# Patient Record
Sex: Female | Born: 1960 | Race: Black or African American | Hispanic: Refuse to answer | Marital: Single | Smoking: Never smoker
Health system: Southern US, Community
[De-identification: ages and names within clinical notes are randomized; demographics above are authoritative.]

## PROBLEM LIST (undated history)

## (undated) DIAGNOSIS — B029 Zoster without complications: Secondary | ICD-10-CM

## (undated) DIAGNOSIS — K8020 Calculus of gallbladder without cholecystitis without obstruction: Secondary | ICD-10-CM

## (undated) DIAGNOSIS — D219 Benign neoplasm of connective and other soft tissue, unspecified: Secondary | ICD-10-CM

## (undated) DIAGNOSIS — R7989 Other specified abnormal findings of blood chemistry: Principal | ICD-10-CM

## (undated) DIAGNOSIS — K219 Gastro-esophageal reflux disease without esophagitis: Secondary | ICD-10-CM

## (undated) DIAGNOSIS — J309 Allergic rhinitis, unspecified: Secondary | ICD-10-CM

## (undated) HISTORY — DX: Other specified abnormal findings of blood chemistry: R79.89

## (undated) HISTORY — DX: Gastro-esophageal reflux disease without esophagitis: K21.9

## (undated) HISTORY — DX: Allergic rhinitis, unspecified: J30.9

## (undated) HISTORY — DX: Calculus of gallbladder without cholecystitis without obstruction: K80.20

## (undated) HISTORY — DX: Zoster without complications: B02.9

## (undated) HISTORY — DX: Morbid (severe) obesity due to excess calories: E66.01

## (undated) HISTORY — DX: Benign neoplasm of connective and other soft tissue, unspecified: D21.9

---

## 2000-01-20 ENCOUNTER — Other Ambulatory Visit: Admission: RE | Admit: 2000-01-20 | Discharge: 2000-01-20 | Payer: Self-pay | Admitting: Obstetrics and Gynecology

## 2001-02-22 ENCOUNTER — Other Ambulatory Visit: Admission: RE | Admit: 2001-02-22 | Discharge: 2001-02-22 | Payer: Self-pay | Admitting: Obstetrics and Gynecology

## 2001-03-16 ENCOUNTER — Encounter: Payer: Self-pay | Admitting: Obstetrics and Gynecology

## 2001-03-16 ENCOUNTER — Ambulatory Visit (HOSPITAL_COMMUNITY): Admission: RE | Admit: 2001-03-16 | Discharge: 2001-03-16 | Payer: Self-pay | Admitting: Obstetrics and Gynecology

## 2002-02-26 ENCOUNTER — Other Ambulatory Visit: Admission: RE | Admit: 2002-02-26 | Discharge: 2002-02-26 | Payer: Self-pay | Admitting: Obstetrics and Gynecology

## 2003-02-28 ENCOUNTER — Other Ambulatory Visit: Admission: RE | Admit: 2003-02-28 | Discharge: 2003-02-28 | Payer: Self-pay | Admitting: Obstetrics and Gynecology

## 2004-03-04 ENCOUNTER — Other Ambulatory Visit: Admission: RE | Admit: 2004-03-04 | Discharge: 2004-03-04 | Payer: Self-pay | Admitting: Obstetrics and Gynecology

## 2004-04-12 HISTORY — PX: LAPAROSCOPIC GASTRIC BANDING: SHX1100

## 2004-05-21 ENCOUNTER — Ambulatory Visit (HOSPITAL_COMMUNITY): Admission: RE | Admit: 2004-05-21 | Discharge: 2004-05-21 | Payer: Self-pay | Admitting: General Surgery

## 2004-05-21 ENCOUNTER — Encounter: Admission: RE | Admit: 2004-05-21 | Discharge: 2004-08-19 | Payer: Self-pay | Admitting: General Surgery

## 2004-09-25 ENCOUNTER — Encounter: Admission: RE | Admit: 2004-09-25 | Discharge: 2004-12-24 | Payer: Self-pay | Admitting: General Surgery

## 2004-09-28 ENCOUNTER — Observation Stay (HOSPITAL_COMMUNITY): Admission: RE | Admit: 2004-09-28 | Discharge: 2004-09-29 | Payer: Self-pay | Admitting: General Surgery

## 2005-04-02 ENCOUNTER — Other Ambulatory Visit: Admission: RE | Admit: 2005-04-02 | Discharge: 2005-04-02 | Payer: Self-pay | Admitting: Obstetrics and Gynecology

## 2005-05-07 ENCOUNTER — Ambulatory Visit: Payer: Self-pay | Admitting: Internal Medicine

## 2005-05-11 ENCOUNTER — Ambulatory Visit: Payer: Self-pay | Admitting: Internal Medicine

## 2005-12-09 ENCOUNTER — Encounter: Admission: RE | Admit: 2005-12-09 | Discharge: 2005-12-09 | Payer: Self-pay | Admitting: General Surgery

## 2006-03-16 ENCOUNTER — Ambulatory Visit: Payer: Self-pay | Admitting: Internal Medicine

## 2006-04-13 ENCOUNTER — Ambulatory Visit: Payer: Self-pay | Admitting: Internal Medicine

## 2007-03-14 ENCOUNTER — Ambulatory Visit (HOSPITAL_COMMUNITY): Admission: RE | Admit: 2007-03-14 | Discharge: 2007-03-14 | Payer: Self-pay | Admitting: General Surgery

## 2007-04-25 ENCOUNTER — Telehealth: Payer: Self-pay | Admitting: Internal Medicine

## 2007-10-02 ENCOUNTER — Telehealth: Payer: Self-pay | Admitting: Internal Medicine

## 2008-02-15 ENCOUNTER — Ambulatory Visit: Payer: Self-pay | Admitting: Internal Medicine

## 2008-02-15 DIAGNOSIS — J309 Allergic rhinitis, unspecified: Secondary | ICD-10-CM | POA: Insufficient documentation

## 2008-02-15 DIAGNOSIS — K219 Gastro-esophageal reflux disease without esophagitis: Secondary | ICD-10-CM

## 2008-02-15 HISTORY — DX: Morbid (severe) obesity due to excess calories: E66.01

## 2008-02-15 HISTORY — DX: Gastro-esophageal reflux disease without esophagitis: K21.9

## 2008-02-15 HISTORY — DX: Allergic rhinitis, unspecified: J30.9

## 2008-07-26 ENCOUNTER — Ambulatory Visit: Payer: Self-pay | Admitting: Internal Medicine

## 2008-07-26 DIAGNOSIS — R079 Chest pain, unspecified: Secondary | ICD-10-CM

## 2008-07-26 DIAGNOSIS — B029 Zoster without complications: Secondary | ICD-10-CM

## 2008-07-26 HISTORY — DX: Zoster without complications: B02.9

## 2009-08-25 ENCOUNTER — Telehealth: Payer: Self-pay | Admitting: Internal Medicine

## 2009-11-13 ENCOUNTER — Ambulatory Visit: Payer: Self-pay | Admitting: Internal Medicine

## 2009-11-14 ENCOUNTER — Ambulatory Visit: Payer: Self-pay | Admitting: Internal Medicine

## 2010-01-10 LAB — HM PAP SMEAR: HM Pap smear: NORMAL

## 2010-05-12 NOTE — Assessment & Plan Note (Signed)
Summary: follow up-lb   Vital Signs:  Patient profile:   50 year old female Height:      66 inches Weight:      277 pounds BMI:     44.87 O2 Sat:      94 % on Room air Temp:     98.4 degrees F oral Pulse rate:   71 / minute Pulse rhythm:   regular Resp:     16 per minute BP sitting:   100 / 60  (right arm) Cuff size:   large  Vitals Entered By: Lanier Prude, CMA(AAMA) (November 14, 2009 4:27 PM)  O2 Flow:  Room air CC: f/u Is Patient Diabetic? No Comments pt states she is not taking Protonix, Vit D or Loratadine.  please remove from list   Primary Care Provider:  Tresa Garter MD  CC:  f/u.  History of Present Illness: F/u GERD  Current Medications (verified): 1)  Vitamin D3 1000 Unit  Tabs (Cholecalciferol) .Marland Kitchen.. 1 By Mouth Daily 2)  Protonix 40 Mg  Tbec (Pantoprazole Sodium) .... Once Daily 3)  Loratadine 10 Mg  Tabs (Loratadine) .... Once Daily As Needed Allergies 4)  Nexium 40 Mg Cpdr (Esomeprazole Magnesium) .Marland Kitchen.. 1 As Needed 5)  Multivitamins  Tabs (Multiple Vitamin) .Marland Kitchen.. 1 Once Daily  Allergies (verified): No Known Drug Allergies  Past History:  Social History: Last updated: 07/26/2008 Single Never Smoked  Past Medical History: GERD Obesity Allergic rhinitis GYN B Silva  Review of Systems  The patient denies chest pain.    Physical Exam  General:  overweight-appearing.   Nose:  External nasal examination shows no deformity or inflammation. Nasal mucosa are pink and moist without lesions or exudates. Mouth:  Oral mucosa and oropharynx without lesions or exudates.  Teeth in good repair. Neck:  No deformities, masses, or tenderness noted. Lungs:  Normal respiratory effort, chest expands symmetrically. Lungs are clear to auscultation, no crackles or wheezes. Heart:  Normal rate and regular rhythm. S1 and S2 normal without gallop, murmur, click, rub or other extra sounds. Abdomen:  Bowel sounds positive,abdomen soft and non-tender without  masses, organomegaly or hernias noted.   Impression & Recommendations:  Problem # 1:  GERD (ICD-530.81) Assessment Unchanged     Nexium 40 Mg Cpdr (Esomeprazole magnesium) .Marland Kitchen... 1 as needed  Problem # 2:  OBESITY, MORBID (ICD-278.01) Assessment: Unchanged  Problem # 3:  Preventive Health Care (ICD-V70.0) Assessment: Comment Only Yearly w/labs w/Dr Josefine Class  Complete Medication List: 1)  Vitamin D3 1000 Unit Tabs (Cholecalciferol) .Marland Kitchen.. 1 by mouth daily 2)  Protonix 40 Mg Tbec (Pantoprazole sodium) .... Once daily 3)  Loratadine 10 Mg Tabs (Loratadine) .... Once daily as needed allergies 4)  Nexium 40 Mg Cpdr (Esomeprazole magnesium) .Marland Kitchen.. 1 as needed 5)  Multivitamins Tabs (Multiple vitamin) .Marland Kitchen.. 1 once daily  Patient Instructions: 1)  Please schedule a follow-up appointment in 1 year. Prescriptions: NEXIUM 40 MG CPDR (ESOMEPRAZOLE MAGNESIUM) 1 as needed  #30 x 12   Entered and Authorized by:   Tresa Garter MD   Signed by:   Tresa Garter MD on 11/14/2009   Method used:   Print then Give to Patient   RxID:   [id number]

## 2010-05-12 NOTE — Progress Notes (Signed)
Summary: SAMPLES  Phone Note Call from Patient Call back at [phone]   Summary of Call: Patient is requesting samples of Nexium. This is not on med list. Last office visit > 1 year ago.  Initial call taken by: Lamar Sprinkles, CMA,  Aug 25, 2009 11:12 AM  Follow-up for Phone Call        OK if we have Sch ov Follow-up by: Tresa Garter MD,  Aug 25, 2009 5:50 PM  Additional Follow-up for Phone Call Additional follow up Details #1::        pt informed to set up office visit and was given dexilant samples instead of nexium. that was all that was available Additional Follow-up by: Ami Bullins CMA,  Aug 26, 2009 8:39 AM

## 2010-08-25 NOTE — Op Note (Signed)
NAMEMAPLE, ODANIEL               ACCOUNT NO.:  [id number]   MEDICAL RECORD NO.:  [id number]          PATIENT TYPE:  AMB   LOCATION:  DAY                          FACILITY:  [facility]   PHYSICIAN:  Sharlet Salina T. Hoxworth, M.D.DATE OF BIRTH:  1961-01-26   DATE OF PROCEDURE:  03/14/2007  DATE OF DISCHARGE:                               OPERATIVE REPORT   PREOPERATIVE DIAGNOSES:  Leaking lap band system.   POSTOPERATIVE DIAGNOSIS:  Leaking lap band system.   SURGICAL PROCEDURES:  Replacement of lap band port.   SURGEON:  Dr. Johna Sheriff.   ANESTHESIA:  General.   BRIEF HISTORY:  Brianna Bean is a 50 year old female status post lap  band placement over one year ago.  She had initially very good weight  loss but more recently experienced loss of restriction and some weight  regain.  On accessing her band on the last three visits there has been  much less fluid found in the band than expected consistent with a slow  leak.  I have recommended replacement of the port.  The nature of  procedure, indications, risks of bleeding, infection were discussed and  understood.  She is now brought to the operating room for the procedure.   DESCRIPTION OF OPERATION:  The patient brought to the operating room,  placed in the supine position on the operating table and general  orotracheal anesthesia was induced.  She received preoperative IV  antibiotics.  The abdomen was widely sterilely prepped and draped.  Correct patient procedure were verified.  The old scar at the port site  was excised and dissection carried down through subcutaneous tissue with  cautery.  Dissection was carried down onto the port.  The scar tissue  around the port was excised and the sutures exposed and removed and the  port mobilized off the anterior abdominal wall.  The port and tubing  were brought out onto the abdominal wall.  I did not see any obvious  defects in the port or tubing.  There was some thinning of the tubing at  the junction of the port and the lap band tubing but no obvious leak  here.  I divided the tubing just on the band side at the connection.  With the tubing of the port clamped, I did inject fluid under pressure  and I could not identify any definite leak.  Following this a new  flushed port was attached to the tubing after trimming the tubing back a  little bit further.  It was then sutured to the anterior abdominal wall  with four interrupted Prolene sutures.  The tubing was seen to curve  nicely into the abdomen.  The port was then filled with 2.7 mL of  sterile saline.  The wound was irrigated and injected with Marcaine.  Hemostasis assured.  The subcutaneous was closed with running 3-0 Vicryl  and the skin closed with running subcuticular 4-0 Monocryl and  Dermabond.  Sponge, needle and instrument counts correct.  The patient  was taken to recovery in good condition.      Lorne Skeens. Hoxworth, M.D.  Electronically Signed  BTH/MEDQ  D:  03/14/2007  T:  03/14/2007  Job:  [id number]

## 2010-08-28 NOTE — Op Note (Signed)
**Note Brianna Bean** NAMEJEROLENE, KUPFER               ACCOUNT NO.:  [id number]   MEDICAL RECORD NO.:  [id number]          PATIENT TYPE:  OBV   LOCATION:  1511                         FAC[facility]   PHYSICIAN:  Sharlet Salina T. Hoxworth, M.D.DATE OF BIRTH:  11/07/1960   DATE OF PROCEDURE:  09/28/2004  DATE OF DISCHARGE:                                 OPERATIVE REPORT   PREOPERATIVE DIAGNOSES:  Morbid obesity surgery.   POSTOPERATIVE DIAGNOSES:  Morbid obesity surgery.   PROCEDURE:  Laparoscopic adjustable gastric banding.   SURGEON:  Lorne Skeens. Hoxworth, M.D.   ASSISTANT:  Luretha Murphy, M.D.   ANESTHESIA:  General.   BRIEF HISTORY:  Ms. Brianna Bean is a 50 year old female with a lifelong history of  progressive obesity unresponsive to medical management. Her weight is  approximately 360 pounds with a BMI of 58. After extensive preoperative  discussion and education detailed elsewhere, she was brought to the  operating room for a laparoscopic adjustable gastric band placement.   DESCRIPTION OF PROCEDURE:  The patient was brought to the operating room,  placed in supine position on the operating table and general endotracheal  anesthesia was induced. She had been given preoperative antibiotics. Heparin  5000 units had been given subcutaneously. PAS were in place. The abdomen was  widely sterilely prepped and draped. A 1 cm incision was made in the left  subcostal area and access obtained with an OptiView trocar without  difficulty and pneumoperitoneum established. Under direct vision, an 11 mm  trocar was placed through the falciform ligament in the right paraxiphoid  area, a 12 mm trocar in the right upper mid abdomen, a 11 mL trocar in the  left periumbilical space for a camera port and a 5 mm trocar in the left  flank. Through a 5 mm port site in the epigastrium, the Nathanson retractor  was placed and the left lobe of the liver elevated with excellent exposure  of the upper stomach and hiatus.  The area was examined and I did not see any  evidence of any significant hiatal hernia. The left crus was identified and  the peritoneum over the left crus was incised with the hook cautery leaving  more lateral attachments to the fundus intact. Using the finger dissector,  blunt dissection was carried down along the left crus toward the  retrogastric area. Following this, the  avascular portion of the pars  flaccida was opened with the finger retractor. The vena cava and right crus  were identified. At the base of the right crus at the area of the crossing  band of fat, the retroperitoneum was carefully opened with blunt dissection  down just medial to the right crus. The finger dissector was then introduced  into the space from the paraxiphoid port and was passed in the retrogastric  space with no force and then the finger flexed and the tip came out nicely  up along the left crus were a few filmy attachments were taken down for it  to come through freely. Following this, the 12 mm port was removed, the site  dilated with the finger and  the flushed band introduced into the abdominal  cavity and the trocar replaced. The tubing was inserted into the finger  retractor which was withdrawn back beneath the stomach and the tubing and  band brought through the retrogastric area without difficulty. The tubing  was then passed through the buckle and with the calibration tube passed  orally by anesthesia to the stomach, the band was snapped into place without  any tension and was seen to still swivel freely. The calibration tube was  removed. The tubing was retracted toward the right lower quadrant exposing  the small gastric pouch. The fundus was then imbricated up over the band to  the gastric pouch with  three interrupted 2-0 Vicryl sutures. The buckle was  seen not to be pressing against this area and the band still rotated freely.  Following this the tubing was brought out through the 12 mm  site, the  [facility] retractor was removed and all CO2 evacuated and trocars removed.  This incision was elongated slightly laterally and dissection carried down  to the rectus sheath with the cautery and this was cleared a couple of  centimeters in all directions. Four sutures of 2-0 Prolene were placed in  four quadrants and passed through the port which had been attached to the  tubing and then the port was securely sutured flat down to the anterior  rectus sheath with these sutures. The tubing was fully introduced into the  abdomen and was seen to curve in gently with no kinks. Following this, the  subcutaneous tissue at this site was closed with running 3-0 Vicryl and skin  incisions were closed with staples. Sponge and needle counts correct. Dry  sterile dressings were applied and the patient taken to recovery in good  condition.       BTH/MEDQ  D:  09/28/2004  T:  09/29/2004  Job:  [id number]

## 2010-11-16 ENCOUNTER — Other Ambulatory Visit: Payer: Self-pay | Admitting: Internal Medicine

## 2010-12-03 ENCOUNTER — Encounter: Payer: Self-pay | Admitting: Internal Medicine

## 2010-12-03 DIAGNOSIS — Z0000 Encounter for general adult medical examination without abnormal findings: Secondary | ICD-10-CM

## 2010-12-04 ENCOUNTER — Other Ambulatory Visit (INDEPENDENT_AMBULATORY_CARE_PROVIDER_SITE_OTHER): Payer: Self-pay

## 2010-12-04 ENCOUNTER — Other Ambulatory Visit: Payer: Self-pay

## 2010-12-04 DIAGNOSIS — Z0000 Encounter for general adult medical examination without abnormal findings: Secondary | ICD-10-CM

## 2010-12-04 LAB — COMPREHENSIVE METABOLIC PANEL
ALT: 16 U/L (ref 0–35)
Albumin: 3.4 g/dL — ABNORMAL LOW (ref 3.5–5.2)
Alkaline Phosphatase: 72 U/L (ref 39–117)
Glucose, Bld: 88 mg/dL (ref 70–99)
Potassium: 4.2 mEq/L (ref 3.5–5.1)
Sodium: 140 mEq/L (ref 135–145)
Total Protein: 7 g/dL (ref 6.0–8.3)

## 2010-12-04 LAB — CBC WITH DIFFERENTIAL/PLATELET
Basophils Absolute: 0 10*3/uL (ref 0.0–0.1)
Eosinophils Relative: 0.9 % (ref 0.0–5.0)
MCV: 73 fl — ABNORMAL LOW (ref 78.0–100.0)
Monocytes Absolute: 0.5 10*3/uL (ref 0.1–1.0)
Monocytes Relative: 10.3 % (ref 3.0–12.0)
Neutrophils Relative %: 49.1 % (ref 43.0–77.0)
Platelets: 298 10*3/uL (ref 150.0–400.0)
RDW: 15.5 % — ABNORMAL HIGH (ref 11.5–14.6)
WBC: 4.5 10*3/uL (ref 4.5–10.5)

## 2010-12-04 LAB — URINALYSIS, ROUTINE W REFLEX MICROSCOPIC
Leukocytes, UA: NEGATIVE
Nitrite: NEGATIVE
Total Protein, Urine: NEGATIVE
Urobilinogen, UA: 4 (ref 0.0–1.0)

## 2010-12-04 LAB — TSH: TSH: 1.92 u[IU]/mL (ref 0.35–5.50)

## 2010-12-04 LAB — LIPID PANEL
Cholesterol: 154 mg/dL (ref 0–200)
LDL Cholesterol: 93 mg/dL (ref 0–99)
Total CHOL/HDL Ratio: 3
VLDL: 5.4 mg/dL (ref 0.0–40.0)

## 2010-12-15 ENCOUNTER — Encounter: Payer: Self-pay | Admitting: Internal Medicine

## 2011-01-13 ENCOUNTER — Encounter: Payer: Self-pay | Admitting: Internal Medicine

## 2011-01-14 ENCOUNTER — Ambulatory Visit (INDEPENDENT_AMBULATORY_CARE_PROVIDER_SITE_OTHER): Payer: 59 | Admitting: Internal Medicine

## 2011-01-14 ENCOUNTER — Encounter: Payer: Self-pay | Admitting: Internal Medicine

## 2011-01-14 VITALS — BP 120/68 | HR 50 | Temp 98.7°F | Wt 272.0 lb

## 2011-01-14 DIAGNOSIS — Z0000 Encounter for general adult medical examination without abnormal findings: Secondary | ICD-10-CM

## 2011-01-14 DIAGNOSIS — M79605 Pain in left leg: Secondary | ICD-10-CM | POA: Insufficient documentation

## 2011-01-14 DIAGNOSIS — M79609 Pain in unspecified limb: Secondary | ICD-10-CM

## 2011-01-14 DIAGNOSIS — K219 Gastro-esophageal reflux disease without esophagitis: Secondary | ICD-10-CM

## 2011-01-14 DIAGNOSIS — D649 Anemia, unspecified: Secondary | ICD-10-CM

## 2011-01-14 MED ORDER — ESOMEPRAZOLE MAGNESIUM 40 MG PO CPDR: 40.0000 mg | DELAYED_RELEASE_CAPSULE | Freq: Every day | ORAL | Status: DC

## 2011-01-14 NOTE — Assessment & Plan Note (Signed)
Apples, OTC iron Labs in 3 mo

## 2011-01-14 NOTE — Assessment & Plan Note (Addendum)
We discussed age appropriate health related issues, including available/recomended screening tests and vaccinations. We discussed a need for adhering to healthy diet and exercise. Labs/EKG were reviewed/ordered. All questions were answered. Cont w/wt loss

## 2011-01-14 NOTE — Progress Notes (Signed)
  Subjective:    Patient ID: Brianna Bean, female    DOB: [birth year], 50 y.o.   MRN: [id number]  HPI  The patient is here for a wellness exam. The patient has been doing well overall without major physical or psychological issues going on lately. C/o shins burning at times w/running  Review of Systems  Constitutional: Negative for fever, chills, diaphoresis, activity change, appetite change, fatigue and unexpected weight change.  HENT: Negative for hearing loss, ear Bean, congestion, sore throat, sneezing, mouth sores, neck Bean, dental problem, voice change, postnasal drip and sinus pressure.   Eyes: Negative for Bean and visual disturbance.  Respiratory: Negative for cough, chest tightness, wheezing and stridor.   Cardiovascular: Negative for chest Bean, palpitations and leg swelling.  Gastrointestinal: Negative for nausea, vomiting, abdominal Bean, blood in stool, abdominal distention and rectal Bean.  Genitourinary: Negative for dysuria, hematuria, decreased urine volume, vaginal bleeding, vaginal discharge, difficulty urinating, vaginal Bean and menstrual problem.  Musculoskeletal: Negative for back Bean, joint swelling, arthralgias and gait problem.  Skin: Negative for color change, rash and wound.  Neurological: Negative for dizziness, tremors, syncope, speech difficulty and light-headedness.  Hematological: Negative for adenopathy.  Psychiatric/Behavioral: Negative for suicidal ideas, hallucinations, behavioral problems, confusion, sleep disturbance, dysphoric mood and decreased concentration. The patient is not hyperactive.    Wt Readings from Last 3 Encounters:  01/14/11 272 lb (123.378 kg)  11/14/09 277 lb (125.646 kg)  07/26/08 267 lb (121.11 kg)       Objective:   Physical Exam  Constitutional: She appears well-developed. No distress.       Obese  HENT:  Head: Normocephalic.  Right Ear: External ear normal.  Left Ear: External ear normal.  Nose: Nose normal.    Mouth/Throat: Oropharynx is clear and moist.  Eyes: Conjunctivae are normal. Pupils are equal, round, and reactive to light. Right eye exhibits no discharge. Left eye exhibits no discharge.  Neck: Normal range of motion. Neck supple. No JVD present. No tracheal deviation present. No thyromegaly present.  Cardiovascular: Normal rate, regular rhythm and normal heart sounds.   Pulmonary/Chest: No stridor. No respiratory distress. She has no wheezes.  Abdominal: Soft. Bowel sounds are normal. She exhibits no distension and no mass. There is no tenderness. There is no rebound and no guarding.  Musculoskeletal: She exhibits tenderness (B inner shins). She exhibits no edema.  Lymphadenopathy:    She has no cervical adenopathy.  Neurological: She displays normal reflexes. No cranial nerve deficit. She exhibits normal muscle tone. Coordination normal.  Skin: No rash noted. No erythema.  Psychiatric: She has a normal mood and affect. Her behavior is normal. Judgment and thought content normal.    Lab Results  Component Value Date   WBC 4.5 12/04/2010   HGB 11.4* 12/04/2010   HCT 35.3* 12/04/2010   PLT 298.0 12/04/2010   GLUCOSE 88 12/04/2010   CHOL 154 12/04/2010   TRIG 27.0 12/04/2010   HDL 55.30 12/04/2010   LDLCALC 93 12/04/2010   ALT 16 12/04/2010   AST 16 12/04/2010   NA 140 12/04/2010   K 4.2 12/04/2010   CL 108 12/04/2010   CREATININE 0.8 12/04/2010   BUN 9 12/04/2010   CO2 27 12/04/2010   TSH 1.92 12/04/2010         Assessment & Plan:

## 2011-01-14 NOTE — Assessment & Plan Note (Signed)
Cont w/diet 

## 2011-01-14 NOTE — Assessment & Plan Note (Signed)
B medial shin pain, [facility] ref was offered Ibuprofen prn

## 2011-01-14 NOTE — Assessment & Plan Note (Signed)
Nexium Rx 

## 2011-01-18 LAB — BASIC METABOLIC PANEL
BUN: 8
Chloride: 110
GFR calc Af Amer: >60
Potassium: 4.5
Sodium: 143

## 2011-01-18 LAB — CBC
HCT: 34.5 — ABNORMAL LOW
Hemoglobin: 11.4 — ABNORMAL LOW
MCV: 70.6 — ABNORMAL LOW
RBC: 4.88
WBC: 5.8

## 2011-01-18 LAB — DIFFERENTIAL
Eosinophils Absolute: 0.1 — ABNORMAL LOW
Eosinophils Relative: 1
Lymphocytes Relative: 24
Lymphs Abs: 1.4
Monocytes Relative: 8

## 2011-01-18 LAB — PREGNANCY, URINE: Preg Test, Ur: NEGATIVE

## 2011-04-23 ENCOUNTER — Ambulatory Visit: Payer: 59 | Admitting: Internal Medicine

## 2011-08-25 ENCOUNTER — Encounter (INDEPENDENT_AMBULATORY_CARE_PROVIDER_SITE_OTHER): Payer: Self-pay | Admitting: General Surgery

## 2011-08-25 ENCOUNTER — Ambulatory Visit (INDEPENDENT_AMBULATORY_CARE_PROVIDER_SITE_OTHER): Payer: 59 | Admitting: General Surgery

## 2011-08-25 DIAGNOSIS — Z4651 Encounter for fitting and adjustment of gastric lap band: Secondary | ICD-10-CM

## 2011-08-25 NOTE — Progress Notes (Signed)
Chief complaint: Followup lap band  History:Patient returns for followup of lap band placed June 2006. She has a 10 cm band. She had her port replaced due to leak in 2008. I last saw her one year ago. She states that she lost some weight early in the past year but has regained this in recent months. She is able to eat a little more and is feeling more hungry. She continues to exercise regularly with a personal trainer and running. We reviewed her diet which is excellent she is not resorting to sweets or soft foods. She feels like she needs a fill.  Past medical history: She did not have significant comorbidities despite her preop super morbid obesity  Exam: BP 126/74  Pulse 60  Temp(Src) 96.6 F (35.9 C) (Temporal)  Resp 16  Ht 5\' 6"  (1.676 m)  Wt 282 lb (127.914 kg)  BMI 45.52 kg/m2 Total weight loss 80 pounds Gen.: Appears well in no distress Abdomen: Soft and nontender. Wounds well healed and port site looks fine  With this information we went ahead with a fill today. I added 0.2 cc without difficulty to bring her up to 3.7 cc. She tolerated water well. We have a default return appointment in one year. She will call me if she does not have the desired effect from this fill.

## 2011-09-03 ENCOUNTER — Encounter (INDEPENDENT_AMBULATORY_CARE_PROVIDER_SITE_OTHER): Payer: 59 | Admitting: General Surgery

## 2012-07-05 ENCOUNTER — Encounter (INDEPENDENT_AMBULATORY_CARE_PROVIDER_SITE_OTHER): Payer: Self-pay | Admitting: General Surgery

## 2012-08-18 ENCOUNTER — Telehealth: Payer: Self-pay | Admitting: *Deleted

## 2012-08-18 DIAGNOSIS — Z0000 Encounter for general adult medical examination without abnormal findings: Secondary | ICD-10-CM

## 2012-08-18 NOTE — Telephone Encounter (Signed)
Labs entered into Epic for upcoming CPX appointment. 

## 2012-08-18 NOTE — Telephone Encounter (Signed)
Message copied by Carin Primrose on Fri Aug 18, 2012 10:48 AM ------      Message from: Etheleen Sia      Created: Fri Aug 18, 2012 10:40 AM      Regarding: LAB       PHYSICAL LABS IN [place] ------

## 2012-09-01 ENCOUNTER — Other Ambulatory Visit (INDEPENDENT_AMBULATORY_CARE_PROVIDER_SITE_OTHER): Payer: 59

## 2012-09-01 DIAGNOSIS — Z0000 Encounter for general adult medical examination without abnormal findings: Secondary | ICD-10-CM

## 2012-09-01 LAB — CBC WITH DIFFERENTIAL/PLATELET
Basophils Relative: 0.6 % (ref 0.0–3.0)
Eosinophils Relative: 0.3 % (ref 0.0–5.0)
HCT: 38 % (ref 36.0–46.0)
Lymphs Abs: 2 10*3/uL (ref 0.7–4.0)
MCV: 71.5 fl — ABNORMAL LOW (ref 78.0–100.0)
Monocytes Absolute: 0.5 10*3/uL (ref 0.1–1.0)
Monocytes Relative: 8.3 % (ref 3.0–12.0)
Neutrophils Relative %: 53.5 % (ref 43.0–77.0)
RBC: 5.31 Mil/uL — ABNORMAL HIGH (ref 3.87–5.11)
WBC: 5.4 10*3/uL (ref 4.5–10.5)

## 2012-09-01 LAB — URINALYSIS
Bilirubin Urine: NEGATIVE
Hgb urine dipstick: NEGATIVE
Total Protein, Urine: NEGATIVE
Urine Glucose: NEGATIVE
Urobilinogen, UA: 2 (ref 0.0–1.0)

## 2012-09-01 LAB — HEPATIC FUNCTION PANEL
ALT: 11 U/L (ref 0–35)
AST: 16 U/L (ref 0–37)
Bilirubin, Direct: 0.1 mg/dL (ref 0.0–0.3)
Total Protein: 7.9 g/dL (ref 6.0–8.3)

## 2012-09-01 LAB — LIPID PANEL
Cholesterol: 173 mg/dL (ref 0–200)
LDL Cholesterol: 107 mg/dL — ABNORMAL HIGH (ref 0–99)
Total CHOL/HDL Ratio: 3
VLDL: 6.2 mg/dL (ref 0.0–40.0)

## 2012-09-01 LAB — BASIC METABOLIC PANEL
Chloride: 105 mEq/L (ref 96–112)
GFR: 106.75 mL/min (ref >60.00)
Potassium: 4.6 mEq/L (ref 3.5–5.1)

## 2012-09-01 LAB — TSH: TSH: 1.66 u[IU]/mL (ref 0.35–5.50)

## 2012-09-13 ENCOUNTER — Ambulatory Visit (INDEPENDENT_AMBULATORY_CARE_PROVIDER_SITE_OTHER): Payer: BC Managed Care – PPO | Admitting: Internal Medicine

## 2012-09-13 ENCOUNTER — Other Ambulatory Visit: Payer: BC Managed Care – PPO

## 2012-09-13 ENCOUNTER — Encounter: Payer: Self-pay | Admitting: Internal Medicine

## 2012-09-13 VITALS — BP 112/82 | HR 76 | Temp 98.1°F | Resp 16 | Ht 65.0 in | Wt 266.0 lb

## 2012-09-13 DIAGNOSIS — K219 Gastro-esophageal reflux disease without esophagitis: Secondary | ICD-10-CM

## 2012-09-13 DIAGNOSIS — L500 Allergic urticaria: Secondary | ICD-10-CM

## 2012-09-13 DIAGNOSIS — Z0000 Encounter for general adult medical examination without abnormal findings: Secondary | ICD-10-CM

## 2012-09-13 MED ORDER — TRIAMCINOLONE ACETONIDE 0.5 % EX CREA: TOPICAL_CREAM | Freq: Three times a day (TID) | CUTANEOUS | Status: DC

## 2012-09-13 NOTE — Progress Notes (Signed)
   Subjective:    HPI  The patient is here for a wellness exam. The patient has been doing well overall without major physical or psychological issues going on lately, except for hives - ?food triggered and GERD sx's at times  Wt Readings from Last 3 Encounters:  09/13/12 266 lb (120.657 kg)  08/25/11 282 lb (127.914 kg)  01/14/11 272 lb (123.378 kg)   BP Readings from Last 3 Encounters:  09/13/12 112/82  08/25/11 126/74  01/14/11 120/68      Review of Systems  Constitutional: Negative for fever, chills, diaphoresis, activity change, appetite change, fatigue and unexpected weight change.  HENT: Negative for hearing loss, ear pain, congestion, sore throat, sneezing, mouth sores, neck pain, dental problem, voice change, postnasal drip and sinus pressure.   Eyes: Negative for pain and visual disturbance.  Respiratory: Negative for cough, chest tightness, wheezing and stridor.   Cardiovascular: Negative for chest pain, palpitations and leg swelling.  Gastrointestinal: Negative for nausea, vomiting, abdominal pain, blood in stool, abdominal distention and rectal pain.  Genitourinary: Negative for dysuria, hematuria, decreased urine volume, vaginal bleeding, vaginal discharge, difficulty urinating, vaginal pain and menstrual problem.  Musculoskeletal: Negative for back pain, joint swelling, arthralgias and gait problem.  Skin: Negative for color change, rash and wound.  Neurological: Negative for dizziness, tremors, syncope, speech difficulty and light-headedness.  Hematological: Negative for adenopathy.  Psychiatric/Behavioral: Negative for suicidal ideas, hallucinations, behavioral problems, confusion, sleep disturbance, dysphoric mood and decreased concentration. The patient is not hyperactive.         Objective:   Physical Exam  Constitutional: She appears well-developed. No distress.  Obese  HENT:  Head: Normocephalic.  Right Ear: External ear normal.  Left Ear: External  ear normal.  Nose: Nose normal.  Mouth/Throat: Oropharynx is clear and moist.  Eyes: Conjunctivae are normal. Pupils are equal, round, and reactive to light. Right eye exhibits no discharge. Left eye exhibits no discharge.  Neck: Normal range of motion. Neck supple. No JVD present. No tracheal deviation present. No thyromegaly present.  Cardiovascular: Normal rate, regular rhythm and normal heart sounds.   Pulmonary/Chest: No stridor. No respiratory distress. She has no wheezes.  Abdominal: Soft. Bowel sounds are normal. She exhibits no distension and no mass. There is no tenderness. There is no rebound and no guarding.  Musculoskeletal: She exhibits no edema and no tenderness.  Lymphadenopathy:    She has no cervical adenopathy.  Neurological: She displays normal reflexes. No cranial nerve deficit. She exhibits normal muscle tone. Coordination normal.  Skin: Rash noted. No erythema.  4 mm hive R cheek  Psychiatric: She has a normal mood and affect. Her behavior is normal. Judgment and thought content normal.    Lab Results  Component Value Date   WBC 5.4 09/01/2012   HGB 12.4 09/01/2012   HCT 38.0 09/01/2012   PLT 362.0 09/01/2012   GLUCOSE 86 09/01/2012   CHOL 173 09/01/2012   TRIG 31.0 09/01/2012   HDL 60.10 09/01/2012   LDLCALC 107* 09/01/2012   ALT 11 09/01/2012   AST 16 09/01/2012   NA 137 09/01/2012   K 4.6 09/01/2012   CL 105 09/01/2012   CREATININE 0.7 09/01/2012   BUN 12 09/01/2012   CO2 27 09/01/2012   TSH 1.66 09/01/2012         Assessment & Plan:

## 2012-09-13 NOTE — Assessment & Plan Note (Signed)
Nexium 

## 2012-09-13 NOTE — Assessment & Plan Note (Signed)
?  food allergy Food IgE

## 2012-09-13 NOTE — Assessment & Plan Note (Signed)
Wt Readings from Last 3 Encounters:  09/13/12 266 lb (120.657 kg)  08/25/11 282 lb (127.914 kg)  01/14/11 272 lb (123.378 kg)

## 2012-09-13 NOTE — Addendum Note (Signed)
Addended by: Merrilyn Puma on: 09/13/2012 04:45 PM   Modules accepted: Orders

## 2012-09-13 NOTE — Assessment & Plan Note (Addendum)
We discussed age appropriate health related issues, including available/recomended screening tests and vaccinations. We discussed a need for adhering to healthy diet and exercise. Labs/EKG were reviewed/ordered. All questions were answered.  [address]on [address] 1/14 nl

## 2012-09-14 LAB — ALLERGEN FOOD PROFILE SPECIFIC IGE
Apple: 0.13 kU/L — ABNORMAL HIGH
Chicken IgE: <0.1 kU/L
Egg White IgE: <0.1 kU/L
Fish Cod: <0.1 kU/L
IgE (Immunoglobulin E), Serum: 38.6 IU/mL (ref 0.0–180.0)
Milk IgE: <0.1 kU/L
Orange: <0.1 kU/L
Shrimp IgE: <0.1 kU/L
Soybean IgE: <0.1 kU/L
Tuna IgE: <0.1 kU/L

## 2012-10-19 ENCOUNTER — Encounter: Payer: Self-pay | Admitting: Internal Medicine

## 2012-10-20 ENCOUNTER — Other Ambulatory Visit: Payer: Self-pay | Admitting: *Deleted

## 2012-10-20 MED ORDER — TRIAMCINOLONE ACETONIDE 0.5 % EX CREA: TOPICAL_CREAM | Freq: Three times a day (TID) | CUTANEOUS | Status: DC

## 2013-02-15 ENCOUNTER — Other Ambulatory Visit: Payer: Self-pay

## 2013-02-23 ENCOUNTER — Encounter (INDEPENDENT_AMBULATORY_CARE_PROVIDER_SITE_OTHER): Payer: Self-pay | Admitting: General Surgery

## 2013-02-23 ENCOUNTER — Encounter (INDEPENDENT_AMBULATORY_CARE_PROVIDER_SITE_OTHER): Payer: BC Managed Care – PPO | Admitting: General Surgery

## 2013-02-23 ENCOUNTER — Ambulatory Visit (INDEPENDENT_AMBULATORY_CARE_PROVIDER_SITE_OTHER): Payer: BC Managed Care – PPO | Admitting: General Surgery

## 2013-02-23 VITALS — BP 138/82 | HR 80 | Resp 16 | Ht 66.0 in | Wt 274.2 lb

## 2013-02-23 DIAGNOSIS — Z4651 Encounter for fitting and adjustment of gastric lap band: Secondary | ICD-10-CM

## 2013-02-23 NOTE — Progress Notes (Signed)
Chief complaint: Followup lap band  History: The patient returns for routine followup for lap band surgery date of 09/28/2004. I last saw her in May of 2013. At that time we did a small fill of 0.2 cc due to some degree of easing of her restriction. At that time she weighed 282 pounds which is a 79 pound weight loss from surgery. She currently weighs 274 pounds representing 87 pound weight loss. She states that however after her fill she was exercising regularly and got down below 260. She had a firm moved and developed time problems and fell out of her exercise routine. She remains apparently quite compliant on her diet with no suite liquids or solid foods. She has noted some easing of restriction over the past 6 months or so. She is able the more than a cup of food. She has no regurgitation or food sticking episodes. She has occasional reflux but not persisted.  Past Medical History  Diagnosis Date  . 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 ALLERGIC RHINITIS 02/15/2008  . GERD 02/15/2008  . OBESITY, MORBID 02/15/2008   Past Surgical History  Procedure Laterality Date  . Laparoscopic gastric banding     Current Outpatient Prescriptions  Medication Sig Dispense Refill  . esomeprazole (NEXIUM) 40 MG capsule Take 1 capsule (40 mg total) by mouth daily before breakfast.  90 capsule  3   No current facility-administered medications for this visit.   No Known Allergies Exam: BP 138/82  Pulse 80  Resp 16  Ht 5\' 6"  (1.676 m)  Wt 274 lb 3.2 oz (124.376 kg)  BMI 44.28 kg/m2 7 pound weight loss since last visit, total weight loss 87 pounds General: Obese but well-appearing African female in no distress Lungs: Clear equal breath sounds bilaterally Cardiac: Regular rate and rhythm. No edema Abdomen: Port site looks fine without swelling or erythema. No palpable hernias. Soft and nontender.  Assessment and plan: Status post lap band placement June 2006. She started at a very high BMI of 58. She has had pretty  good weight loss of 87 pounds and BMI decreased to 44. No significant preop work current comorbidities.  With the information above we elected to go ahead with a fill today. I added 0.2 cc to bring her up to an expected volume of 3.9 cc. She was able to tolerate water well. She is committed to getting back on her exercise program. She has an appointment in one year at the latest.

## 2013-03-15 ENCOUNTER — Encounter (INDEPENDENT_AMBULATORY_CARE_PROVIDER_SITE_OTHER): Payer: BC Managed Care – PPO | Admitting: General Surgery

## 2013-03-30 ENCOUNTER — Encounter (INDEPENDENT_AMBULATORY_CARE_PROVIDER_SITE_OTHER): Payer: BC Managed Care – PPO | Admitting: General Surgery

## 2013-06-21 DIAGNOSIS — D219 Benign neoplasm of connective and other soft tissue, unspecified: Secondary | ICD-10-CM

## 2013-06-21 HISTORY — DX: Benign neoplasm of connective and other soft tissue, unspecified: D21.9

## 2014-01-10 LAB — HM PAP SMEAR: HM PAP: NORMAL

## 2014-01-10 LAB — HM MAMMOGRAPHY: HM MAMMO: NORMAL

## 2014-07-19 ENCOUNTER — Encounter: Payer: Self-pay | Admitting: Internal Medicine

## 2014-08-12 ENCOUNTER — Other Ambulatory Visit (INDEPENDENT_AMBULATORY_CARE_PROVIDER_SITE_OTHER): Payer: BLUE CROSS/BLUE SHIELD

## 2014-08-12 ENCOUNTER — Ambulatory Visit (INDEPENDENT_AMBULATORY_CARE_PROVIDER_SITE_OTHER): Payer: BLUE CROSS/BLUE SHIELD | Admitting: Internal Medicine

## 2014-08-12 ENCOUNTER — Encounter: Payer: Self-pay | Admitting: Internal Medicine

## 2014-08-12 VITALS — BP 130/80 | HR 64 | Ht 66.0 in | Wt 276.0 lb

## 2014-08-12 DIAGNOSIS — E538 Deficiency of other specified B group vitamins: Secondary | ICD-10-CM

## 2014-08-12 DIAGNOSIS — R1011 Right upper quadrant pain: Secondary | ICD-10-CM

## 2014-08-12 DIAGNOSIS — Z0000 Encounter for general adult medical examination without abnormal findings: Secondary | ICD-10-CM | POA: Diagnosis not present

## 2014-08-12 DIAGNOSIS — K219 Gastro-esophageal reflux disease without esophagitis: Secondary | ICD-10-CM | POA: Diagnosis not present

## 2014-08-12 DIAGNOSIS — E559 Vitamin D deficiency, unspecified: Secondary | ICD-10-CM | POA: Diagnosis not present

## 2014-08-12 LAB — HEPATIC FUNCTION PANEL
ALT: 8 U/L (ref 0–35)
AST: 11 U/L (ref 0–37)
Albumin: 3.8 g/dL (ref 3.5–5.2)
Alkaline Phosphatase: 76 U/L (ref 39–117)
BILIRUBIN DIRECT: 0.1 mg/dL (ref 0.0–0.3)
TOTAL PROTEIN: 7.7 g/dL (ref 6.0–8.3)
Total Bilirubin: 0.5 mg/dL (ref 0.2–1.2)

## 2014-08-12 LAB — CBC WITH DIFFERENTIAL/PLATELET
BASOS PCT: 0.4 % (ref 0.0–3.0)
Basophils Absolute: 0 10*3/uL (ref 0.0–0.1)
EOS ABS: 0 10*3/uL (ref 0.0–0.7)
Eosinophils Relative: 0.3 % (ref 0.0–5.0)
HCT: 37.5 % (ref 36.0–46.0)
HEMOGLOBIN: 12.2 g/dL (ref 12.0–15.0)
Lymphocytes Relative: 28.6 % (ref 12.0–46.0)
Lymphs Abs: 1.5 10*3/uL (ref 0.7–4.0)
MCHC: 32.6 g/dL (ref 30.0–36.0)
MCV: 70.3 fl — AB (ref 78.0–100.0)
MONO ABS: 0.4 10*3/uL (ref 0.1–1.0)
MONOS PCT: 8.3 % (ref 3.0–12.0)
NEUTROS ABS: 3.2 10*3/uL (ref 1.4–7.7)
Neutrophils Relative %: 62.4 % (ref 43.0–77.0)
Platelets: 314 10*3/uL (ref 150.0–400.0)
RBC: 5.33 Mil/uL — AB (ref 3.87–5.11)
RDW: 15.5 % (ref 11.5–15.5)
WBC: 5.2 10*3/uL (ref 4.0–10.5)

## 2014-08-12 LAB — TSH: TSH: 1.28 u[IU]/mL (ref 0.35–4.50)

## 2014-08-12 LAB — URINALYSIS
Hgb urine dipstick: NEGATIVE
Leukocytes, UA: NEGATIVE
NITRITE: NEGATIVE
Total Protein, Urine: NEGATIVE
URINE GLUCOSE: NEGATIVE
UROBILINOGEN UA: 1 (ref 0.0–1.0)
pH: 6 (ref 5.0–8.0)

## 2014-08-12 LAB — LIPID PANEL
CHOL/HDL RATIO: 3
Cholesterol: 189 mg/dL (ref 0–200)
HDL: 55.5 mg/dL (ref >39.00)
LDL CALC: 124 mg/dL — AB (ref 0–99)
NONHDL: 133.5
TRIGLYCERIDES: 49 mg/dL (ref 0.0–149.0)
VLDL: 9.8 mg/dL (ref 0.0–40.0)

## 2014-08-12 LAB — BASIC METABOLIC PANEL
BUN: 10 mg/dL (ref 6–23)
CO2: 28 mEq/L (ref 19–32)
CREATININE: 0.76 mg/dL (ref 0.40–1.20)
Calcium: 9.5 mg/dL (ref 8.4–10.5)
Chloride: 104 mEq/L (ref 96–112)
GFR: 102.72 mL/min (ref >60.00)
GLUCOSE: 87 mg/dL (ref 70–99)
Potassium: 4 mEq/L (ref 3.5–5.1)
SODIUM: 137 meq/L (ref 135–145)

## 2014-08-12 LAB — VITAMIN B12: Vitamin B-12: 186 pg/mL — ABNORMAL LOW (ref 211–911)

## 2014-08-12 LAB — VITAMIN D 25 HYDROXY (VIT D DEFICIENCY, FRACTURES): VITD: 5.16 ng/mL — ABNORMAL LOW (ref 30.00–100.00)

## 2014-08-12 LAB — HEMOGLOBIN A1C: Hgb A1c MFr Bld: 5.6 % (ref 4.6–6.5)

## 2014-08-12 MED ORDER — NEXIUM 40 MG PO CPDR: 40.0000 mg | DELAYED_RELEASE_CAPSULE | Freq: Every day | ORAL | Status: DC

## 2014-08-12 NOTE — Progress Notes (Signed)
Pre visit review using our clinic review tool, if applicable. No additional management support is needed unless otherwise documented below in the visit note. 

## 2014-08-12 NOTE — Assessment & Plan Note (Signed)
Sporadic pain - r/o GS Labs Abd [place]

## 2014-08-12 NOTE — Assessment & Plan Note (Signed)
We discussed age appropriate health related issues, including available/recomended screening tests and vaccinations. We discussed a need for adhering to healthy diet and exercise. Labs/EKG were reviewed/ordered. All questions were answered. Colon Dr Collene Mares 1/14 nl GYN Dr Jerl Santos

## 2014-08-12 NOTE — Progress Notes (Signed)
   Subjective:    HPI  The patient is here for a wellness exam. The patient has been doing well overall without major physical or psychological issues going on lately, except for hives - ?food triggered and GERD sx's at times C/o occ R abd pain  Wt Readings from Last 3 Encounters:  08/12/14 276 lb (125.193 kg)  02/23/13 274 lb 3.2 oz (124.376 kg)  09/13/12 266 lb (120.657 kg)   BP Readings from Last 3 Encounters:  08/12/14 130/80  02/23/13 138/82  09/13/12 112/82      Review of Systems  Constitutional: Negative for fever, chills, diaphoresis, activity change, appetite change, fatigue and unexpected weight change.  HENT: Negative for congestion, dental problem, ear pain, hearing loss, mouth sores, postnasal drip, sinus pressure, sneezing, sore throat and voice change.   Eyes: Negative for pain and visual disturbance.  Respiratory: Negative for cough, chest tightness, wheezing and stridor.   Cardiovascular: Negative for chest pain, palpitations and leg swelling.  Gastrointestinal: Negative for nausea, vomiting, abdominal pain, blood in stool, abdominal distention and rectal pain.  Genitourinary: Negative for dysuria, hematuria, decreased urine volume, vaginal bleeding, vaginal discharge, difficulty urinating, vaginal pain and menstrual problem.  Musculoskeletal: Negative for back pain, joint swelling, arthralgias, gait problem and neck pain.  Skin: Negative for color change, rash and wound.  Neurological: Negative for dizziness, tremors, syncope, speech difficulty and light-headedness.  Hematological: Negative for adenopathy.  Psychiatric/Behavioral: Negative for suicidal ideas, hallucinations, behavioral problems, confusion, sleep disturbance, dysphoric mood and decreased concentration. The patient is not hyperactive.         Objective:   Physical Exam  Constitutional: She appears well-developed. No distress.  Obese  HENT:  Head: Normocephalic.  Right Ear: External ear  normal.  Left Ear: External ear normal.  Nose: Nose normal.  Mouth/Throat: Oropharynx is clear and moist.  Eyes: Conjunctivae are normal. Pupils are equal, round, and reactive to light. Right eye exhibits no discharge. Left eye exhibits no discharge.  Neck: Normal range of motion. Neck supple. No JVD present. No tracheal deviation present. No thyromegaly present.  Cardiovascular: Normal rate, regular rhythm and normal heart sounds.   Pulmonary/Chest: No stridor. No respiratory distress. She has no wheezes.  Abdominal: Soft. Bowel sounds are normal. She exhibits no distension and no mass. There is no tenderness. There is no rebound and no guarding.  Musculoskeletal: She exhibits no edema or tenderness.  Lymphadenopathy:    She has no cervical adenopathy.  Neurological: She displays normal reflexes. No cranial nerve deficit. She exhibits normal muscle tone. Coordination normal.  Skin: No rash noted. No erythema.  Psychiatric: She has a normal mood and affect. Her behavior is normal. Judgment and thought content normal.    Lab Results  Component Value Date   WBC 5.4 09/01/2012   HGB 12.4 09/01/2012   HCT 38.0 09/01/2012   PLT 362.0 09/01/2012   GLUCOSE 86 09/01/2012   CHOL 173 09/01/2012   TRIG 31.0 09/01/2012   HDL 60.10 09/01/2012   LDLCALC 107* 09/01/2012   ALT 11 09/01/2012   AST 16 09/01/2012   NA 137 09/01/2012   K 4.6 09/01/2012   CL 105 09/01/2012   CREATININE 0.7 09/01/2012   BUN 12 09/01/2012   CO2 27 09/01/2012   TSH 1.66 09/01/2012         Assessment & Plan:

## 2014-08-13 ENCOUNTER — Encounter: Payer: Self-pay | Admitting: Internal Medicine

## 2014-08-13 DIAGNOSIS — E538 Deficiency of other specified B group vitamins: Secondary | ICD-10-CM | POA: Insufficient documentation

## 2014-08-13 DIAGNOSIS — E559 Vitamin D deficiency, unspecified: Secondary | ICD-10-CM | POA: Insufficient documentation

## 2014-08-13 MED ORDER — ERGOCALCIFEROL 1.25 MG (50000 UT) PO CAPS: 50000.0000 [IU] | ORAL_CAPSULE | ORAL | Status: DC

## 2014-08-13 MED ORDER — VITAMIN B-12 1000 MCG SL SUBL: 1.0000 | SUBLINGUAL_TABLET | Freq: Every day | SUBLINGUAL | Status: DC

## 2014-08-13 MED ORDER — VITAMIN D 1000 UNITS PO TABS: 1000.0000 [IU] | ORAL_TABLET | Freq: Every day | ORAL | Status: AC

## 2014-08-13 NOTE — Assessment & Plan Note (Signed)
Chronic On Nexium

## 2014-08-13 NOTE — Assessment & Plan Note (Signed)
Start Vit D Rx

## 2014-08-13 NOTE — Assessment & Plan Note (Signed)
Start B12 SL

## 2014-08-13 NOTE — Assessment & Plan Note (Signed)
S/p Lap band Labs  Wt Readings from Last 3 Encounters:  08/12/14 276 lb (125.193 kg)  02/23/13 274 lb 3.2 oz (124.376 kg)  09/13/12 266 lb (120.657 kg)

## 2014-08-16 ENCOUNTER — Ambulatory Visit
Admission: RE | Admit: 2014-08-16 | Discharge: 2014-08-16 | Disposition: A | Discharge status: Home / Self Care | Payer: BLUE CROSS/BLUE SHIELD | Source: Ambulatory Visit | Attending: Internal Medicine | Admitting: Internal Medicine

## 2014-08-19 ENCOUNTER — Encounter: Payer: Self-pay | Admitting: Internal Medicine

## 2014-08-19 ENCOUNTER — Telehealth: Payer: Self-pay | Admitting: *Deleted

## 2014-08-19 NOTE — Telephone Encounter (Signed)
Nexium 40 mg PA approved via CoverMyMeds. Patient's Pharmacy informed.

## 2014-08-25 ENCOUNTER — Other Ambulatory Visit: Payer: Self-pay | Admitting: Internal Medicine

## 2014-08-25 DIAGNOSIS — K8020 Calculus of gallbladder without cholecystitis without obstruction: Secondary | ICD-10-CM

## 2014-09-20 ENCOUNTER — Other Ambulatory Visit: Payer: Self-pay | Admitting: General Surgery

## 2014-12-12 ENCOUNTER — Encounter: Payer: Self-pay | Admitting: Obstetrics and Gynecology

## 2015-01-22 ENCOUNTER — Ambulatory Visit (INDEPENDENT_AMBULATORY_CARE_PROVIDER_SITE_OTHER): Payer: BLUE CROSS/BLUE SHIELD | Admitting: Obstetrics and Gynecology

## 2015-01-22 ENCOUNTER — Encounter: Payer: Self-pay | Admitting: Obstetrics and Gynecology

## 2015-01-22 ENCOUNTER — Other Ambulatory Visit: Payer: Self-pay | Admitting: Obstetrics and Gynecology

## 2015-01-22 VITALS — BP 128/74 | HR 76 | Resp 18 | Ht 65.5 in | Wt 288.8 lb

## 2015-01-22 DIAGNOSIS — N912 Amenorrhea, unspecified: Secondary | ICD-10-CM | POA: Diagnosis not present

## 2015-01-22 DIAGNOSIS — Z01419 Encounter for gynecological examination (general) (routine) without abnormal findings: Secondary | ICD-10-CM

## 2015-01-22 DIAGNOSIS — E221 Hyperprolactinemia: Secondary | ICD-10-CM | POA: Diagnosis not present

## 2015-01-22 DIAGNOSIS — Z1231 Encounter for screening mammogram for malignant neoplasm of breast: Secondary | ICD-10-CM

## 2015-01-22 NOTE — Patient Instructions (Signed)

## 2015-01-22 NOTE — Progress Notes (Signed)
Patient ID: Brianna Bean, female   DOB: [birth year], 54 y.o.   MRN: [id number] 54 y.o. G0P0000 Single African American female here for annual exam.   Patient does state she had a benign endometrial biopsy 05/2013 in [place] for bleeding after no menses for about one year.  Had pelvic ultrasound and was told she had a thin lining.  Has pelvic cramping about the time of "her cycle" or the time she would anticipate she would have a cycle. Not having hot flashes.   Hx of hyperprolactinemia. This has always been a very low level positive level in past.  She also had a negative MRI of the brain in [place].   Will be having a future cholecystectomy with Dr. Excell Seltzer.   Living in [place] for 3 years.    PCP: Walker Kehr, MD     Patient's last menstrual period was 05/13/2013 (approximate).          Sexually active: No. female The current method of family planning is post menopausal status.    Exercising: No.   Smoker:  no  Health Maintenance: Pap:  01/2014 Neg History of abnormal Pap:  no MMG:  01/2013 Normal in [place]:Scheduled patient for a screening mammogram for 02-06-15 2:[facility] Colonoscopy:  05/2012 normal with Dr. Collene Mares.  Next 05/2022. BMD:   n/a  Result  n/a TDaP:  2012 Screening Labs:  Hb today: PCP, Urine today: PCP   reports that she has never smoked. She does not have any smokeless tobacco history on file. She reports that she drinks about 1.8 oz of alcohol per week. She reports that she does not use illicit drugs.  Past Medical History  Diagnosis Date  . 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 ALLERGIC RHINITIS 02/15/2008  . GERD 02/15/2008  . OBESITY, MORBID 02/15/2008  . Gallstones     Past Surgical History  Procedure Laterality Date  . Laparoscopic gastric banding  2006    --Dr. Excell Seltzer    Current Outpatient Prescriptions  Medication Sig Dispense Refill  . cholecalciferol (VITAMIN D) 1000 UNITS tablet Take 1 tablet (1,000 Units total) by mouth daily.  100 tablet 3  . Cyanocobalamin (VITAMIN B-12) 1000 MCG SUBL Place 1 tablet (1,000 mcg total) under the tongue daily. 100 tablet 3  . NEXIUM 40 MG capsule Take 1 capsule (40 mg total) by mouth daily before breakfast. 90 capsule 3   No current facility-administered medications for this visit.    Family History  Problem Relation Age of Onset  . Diabetes Other     1st degree relative  . Other Father     Dec age 31 MVA  . Osteoarthritis Mother   . Hypertension Mother   . Hyperlipidemia Mother   . Stroke Paternal Grandmother     ROS:  Pertinent items are noted in HPI.  Otherwise, a comprehensive ROS was negative.  Exam:   BP 128/74 mmHg  Pulse 76  Resp 18  Ht 5' 5.5" (1.664 m)  Wt 288 lb 12.8 oz (130.999 kg)  BMI 47.31 kg/m2  LMP 05/13/2013 (Approximate)    General appearance: alert, cooperative and appears stated age Head: Normocephalic, without obvious abnormality, atraumatic Neck: no adenopathy, supple, symmetrical, trachea midline and thyroid normal to inspection and palpation Lungs: clear to auscultation bilaterally Breasts: normal appearance, no masses or tenderness, Inspection negative, No nipple retraction or dimpling, No nipple discharge or bleeding, No axillary or supraclavicular adenopathy Heart: regular rate and rhythm Abdomen: soft, non-tender; bowel sounds normal; no  masses,  no organomegaly Extremities: extremities normal, atraumatic, no cyanosis or edema Skin: Skin color, texture, turgor normal. No rashes or lesions Lymph nodes: Cervical, supraclavicular, and axillary nodes normal. No abnormal inguinal nodes palpated Neurologic: Grossly normal  Pelvic: External genitalia:  no lesions              Urethra:  normal appearing urethra with no masses, tenderness or lesions              Bartholins and Skenes: normal                 Vagina: normal appearing vagina with normal color and discharge, no lesions              Cervix: no lesions              Pap taken:  No. Bimanual Exam:  Uterus:  normal size, contour, position, consistency, mobility, non-tender              Adnexa: normal adnexa and no mass, fullness, tenderness              Rectovaginal: Yes.  .  Confirms.              Anus:  normal sphincter tone, no lesions  Chaperone was present for exam.  Assessment:   Well woman visit with normal exam. Hx hyperprolactinemia.   Normal MRI.  Amenorrhea.  Menopausal?  Plan: Yearly mammogram recommended after age 19.   Will [facility] and see [facility] today while patient in town from [place].  Recommended self breast exam.  Pap and HR HPV as above. Discussed Calcium, Vitamin D, regular exercise program including cardiovascular and weight bearing exercise. Labs performed.  Yes.  .   See orders.  Prolactin, FSH, estradiol. Refills given on medications.  No..    Will get records from EMB and MRI of brain.  Follow up annually and prn.      After visit summary provided.

## 2015-01-23 ENCOUNTER — Encounter: Payer: Self-pay | Admitting: Obstetrics and Gynecology

## 2015-01-23 ENCOUNTER — Other Ambulatory Visit: Payer: Self-pay | Admitting: Obstetrics and Gynecology

## 2015-01-23 DIAGNOSIS — N912 Amenorrhea, unspecified: Secondary | ICD-10-CM

## 2015-01-23 LAB — FOLLICLE STIMULATING HORMONE: FSH: 15 m[IU]/mL

## 2015-01-23 LAB — PROLACTIN: PROLACTIN: 27.8 ng/mL

## 2015-01-23 LAB — ESTRADIOL: Estradiol: 45.4 pg/mL

## 2015-01-27 ENCOUNTER — Telehealth: Payer: Self-pay | Admitting: Obstetrics and Gynecology

## 2015-01-27 ENCOUNTER — Telehealth: Payer: Self-pay | Admitting: Emergency Medicine

## 2015-01-27 MED ORDER — MISOPROSTOL 200 MCG PO TABS: ORAL_TABLET | ORAL | Status: DC

## 2015-01-27 NOTE — Telephone Encounter (Signed)
Spoke with patient. She is given message from Dr. Quincy Simmonds and verbalized understanding.  Scheduled for Pelvic ultrasound and Endometrial biopsy for 02/06/15 at 1030.  Pre-procedure instructions given.   Cytotec 200 mcg #2 RF zero sent to pharmacy, instructions given. 1 tablet PO night before the procedure. 1 tablet PO the morning of the procedure. Motrin 800 mg po with food and water prior to appointment.  cc Kerry Hough for insurance pre-certification and patient contact.      Routing to provider for final review. Patient agreeable to disposition. Will close encounter.

## 2015-01-27 NOTE — Telephone Encounter (Signed)
Called patient to review benefits for procedure. Left voicemail to call back and review. °

## 2015-01-27 NOTE — Telephone Encounter (Signed)
-----   Message from Nunzio Cobbs, MD sent at 01/23/2015  5:36 AM EDT ----- Results to patient through My Chart. Please contact patient to facilitate a pelvic ultrasound and EMB visit for amenorrhea and no menopause. I will place orders. Need to rule out hyperplasia. I will need to prescribe Cytotec 200 mcg orally the hs and the am of the procedure. Orders not placed yet. I will plan for a paracervical block also.   Cc- Marisa Sprinkles

## 2015-01-30 ENCOUNTER — Encounter: Payer: Self-pay | Admitting: Obstetrics and Gynecology

## 2015-02-06 ENCOUNTER — Encounter: Payer: Self-pay | Admitting: Obstetrics and Gynecology

## 2015-02-06 ENCOUNTER — Ambulatory Visit
Admission: RE | Admit: 2015-02-06 | Discharge: 2015-02-06 | Disposition: A | Discharge status: Home / Self Care | Payer: BLUE CROSS/BLUE SHIELD | Source: Ambulatory Visit | Attending: Obstetrics and Gynecology | Admitting: Obstetrics and Gynecology

## 2015-02-06 ENCOUNTER — Ambulatory Visit (INDEPENDENT_AMBULATORY_CARE_PROVIDER_SITE_OTHER): Payer: BLUE CROSS/BLUE SHIELD | Admitting: Obstetrics and Gynecology

## 2015-02-06 ENCOUNTER — Ambulatory Visit (INDEPENDENT_AMBULATORY_CARE_PROVIDER_SITE_OTHER): Payer: BLUE CROSS/BLUE SHIELD

## 2015-02-06 DIAGNOSIS — Z1231 Encounter for screening mammogram for malignant neoplasm of breast: Secondary | ICD-10-CM

## 2015-02-06 DIAGNOSIS — N912 Amenorrhea, unspecified: Secondary | ICD-10-CM | POA: Diagnosis not present

## 2015-02-06 MED ORDER — NORETHINDRONE 0.35 MG PO TABS: 1.0000 | ORAL_TABLET | Freq: Every day | ORAL | Status: DC

## 2015-02-06 NOTE — Patient Instructions (Signed)
Norethindrone tablets (contraception) What is this medicine? NORETHINDRONE (nor eth IN drone) is an oral contraceptive. The product contains a female hormone known as a progestin. It is used to prevent pregnancy. This medicine may be used for other purposes; ask your health care provider or pharmacist if you have questions. What should I tell my health care provider before I take this medicine? They need to know if you have any of these conditions: -blood vessel disease or blood clots -breast, cervical, or vaginal cancer -diabetes -heart disease -kidney disease -liver disease -mental depression -migraine -seizures -stroke -vaginal bleeding -an unusual or allergic reaction to norethindrone, other medicines, foods, dyes, or preservatives -pregnant or trying to get pregnant -breast-feeding How should I use this medicine? Take this medicine by mouth with a glass of water. You may take it with or without food. Follow the directions on the prescription label. Take this medicine at the same time each day and in the order directed on the package. Do not take your medicine more often than directed. Contact your pediatrician regarding the use of this medicine in children. Special care may be needed. This medicine has been used in female children who have started having menstrual periods. A patient package insert for the product will be given with each prescription and refill. Read this sheet carefully each time. The sheet may change frequently. Overdosage: If you think you have taken too much of this medicine contact a poison control center or emergency room at once. NOTE: This medicine is only for you. Do not share this medicine with others. What if I miss a dose? Try not to miss a dose. Every time you miss a dose or take a dose late your chance of pregnancy increases. When 1 pill is missed (even if only 3 hours late), take the missed pill as soon as possible and continue taking a pill each day at  the regular time (use a back up method of birth control for the next 48 hours). If more than 1 dose is missed, use an additional birth control method for the rest of your pill pack until menses occurs. Contact your health care professional if more than 1 dose has been missed. What may interact with this medicine? Do not take this medicine with any of the following medications: -amprenavir or fosamprenavir -bosentan This medicine may also interact with the following medications: -antibiotics or medicines for infections, especially rifampin, rifabutin, rifapentine, and griseofulvin, and possibly penicillins or tetracyclines -aprepitant -barbiturate medicines, such as phenobarbital -carbamazepine -felbamate -modafinil -oxcarbazepine -phenytoin -ritonavir or other medicines for HIV infection or AIDS -St. John's wort -topiramate This list may not describe all possible interactions. Give your health care provider a list of all the medicines, herbs, non-prescription drugs, or dietary supplements you use. Also tell them if you smoke, drink alcohol, or use illegal drugs. Some items may interact with your medicine. What should I watch for while using this medicine? Visit your doctor or health care professional for regular checks on your progress. You will need a regular breast and pelvic exam and Pap smear while on this medicine. Use an additional method of birth control during the first cycle that you take these tablets. If you have any reason to think you are pregnant, stop taking this medicine right away and contact your doctor or health care professional. If you are taking this medicine for hormone related problems, it may take several cycles of use to see improvement in your condition. This medicine does not protect you   against HIV infection (AIDS) or any other sexually transmitted diseases. What side effects may I notice from receiving this medicine? Side effects that you should report to your  doctor or health care professional as soon as possible: -breast tenderness or discharge -pain in the abdomen, chest, groin or leg -severe headache -skin rash, itching, or hives -sudden shortness of breath -unusually weak or tired -vision or speech problems -yellowing of skin or eyes Side effects that usually do not require medical attention (report to your doctor or health care professional if they continue or are bothersome): -changes in sexual desire -change in menstrual flow -facial hair growth -fluid retention and swelling -headache -irritability -nausea -weight gain or loss This list may not describe all possible side effects. Call your doctor for medical advice about side effects. You may report side effects to FDA at 1-800-FDA-1088. Where should I keep my medicine? Keep out of the reach of children. Store at room temperature between 15 and 30 degrees C (59 and 86 degrees F). Throw away any unused medicine after the expiration date. NOTE: This sheet is a summary. It may not cover all possible information. If you have questions about this medicine, talk to your doctor, pharmacist, or health care provider.    2016, Elsevier/Gold Standard. (2011-12-17 16:41:35)  

## 2015-02-06 NOTE — Progress Notes (Signed)
Subjective  54 y.o. G0P0000   female here for pelvic ultrasound for amenorrhea and not in menopause.  Labs on 01/22/15: FSH - 15.0 Estradiol - 45.4  Had an endometrial biopsy in [facility] 06/15/13 due to bleeding one year after having amenorrhea and had scant tissue noted.  No actual diagnosis.  Ultrasound there showed 2 myometrial fibroids under 1 cm.   Has mildly elevated prolactin periodically.  Recent prolactin 27.8.  Has mammogram appointment this afternoon at [facility].   Objective  Pelvic ultrasound images and report reviewed with patient.  Uterus - 0.84 cm fibroid. EMS - 4.70 mm. No abnormal blood flow.  Ovaries - normal. Free fluid - no        Assessment  Amenorrhea.  Thin endometrium. I do not believe the patient needs a biopsy.  Small uterine fibroid.  Hx mildly elevated prolactin.  Normal prolactin recently.   Plan  Discussion of fibroids and causes of amenorrhea including PCO, obesity, perimenopause.  Will start Micronor for prevention of hyperplasia after mammogram back and normal.  Instructed in use.  Patient to keep bleeding calendar.  Follow up in 3 months.   _15______ minutes face to face time of which over 50% was spent in counseling.   After visit summary to patient.

## 2015-05-09 ENCOUNTER — Ambulatory Visit: Payer: BLUE CROSS/BLUE SHIELD | Admitting: Obstetrics and Gynecology

## 2015-05-19 ENCOUNTER — Encounter: Payer: Self-pay | Admitting: Obstetrics and Gynecology

## 2015-05-19 ENCOUNTER — Ambulatory Visit (INDEPENDENT_AMBULATORY_CARE_PROVIDER_SITE_OTHER): Payer: Managed Care, Other (non HMO) | Admitting: Obstetrics and Gynecology

## 2015-05-19 VITALS — BP 124/76 | HR 60 | Resp 12 | Ht 65.5 in | Wt 291.2 lb

## 2015-05-19 DIAGNOSIS — Z3009 Encounter for other general counseling and advice on contraception: Secondary | ICD-10-CM

## 2015-05-19 DIAGNOSIS — N912 Amenorrhea, unspecified: Secondary | ICD-10-CM

## 2015-05-19 MED ORDER — NORETHINDRONE 0.35 MG PO TABS: 1.0000 | ORAL_TABLET | Freq: Every day | ORAL | Status: DC

## 2015-05-19 NOTE — Progress Notes (Signed)
GYNECOLOGY  VISIT   HPI: 55 y.o.   Single  African American female   [place] with Patient's last menstrual period was 05/13/2013 (approximate).   here for   3 month recheck of Amenorrhea Now on Micronor for 3 months.  No cramping or spotting.  No concerns or problems.  LMP 05/13/13.   Had a prior benign EMB in [place] and an MRI of brain.   Hx of elevated prolactin.    Level 27.8 on 01/22/15. FSH 15, E2 45.4 on 01/22/15.  Pelvic ultrasound 02/06/15 Pelvic ultrasound images and report reviewed with patient.  Uterus - 0.84 cm fibroid. EMS - 4.70 mm. No abnormal blood flow.  Ovaries - normal. Free fluid - no  Started Micronor for endometrial hyperplasia prevention.   GYNECOLOGIC HISTORY: Patient's last menstrual period was 05/13/2013 (approximate). Contraception:Postmenopausal Menopausal hormone therapy:Micronor [facility] Last pap smear: 01/10/2014 WNL        OB History    Gravida Para Term Preterm AB TAB SAB Ectopic Multiple Living   0 0 0 0 0 0 0 0 0 0          Patient Active Problem List   Diagnosis Date Noted  . Vitamin D deficiency 08/13/2014  . B12 deficiency 08/13/2014  . Abdominal pain, right upper quadrant 08/12/2014  . Allergic urticaria 09/13/2012  . Well adult exam 01/14/2011  . Leg pain, bilateral 01/14/2011  . Anemia 01/14/2011  . 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 CHEST PAIN, UNSPECIFIED 07/26/2008  . OBESITY, MORBID 02/15/2008  . ALLERGIC RHINITIS 02/15/2008  . GERD 02/15/2008    Past Medical History  Diagnosis Date  . 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 ALLERGIC RHINITIS 02/15/2008  . GERD 02/15/2008  . OBESITY, MORBID 02/15/2008  . Gallstones   . Fibroids 06/21/13    U/S [place] - 2 fibroids less than 1 cm    Past Surgical History  Procedure Laterality Date  . Laparoscopic gastric banding  2006    --Dr. Excell Seltzer    Current Outpatient Prescriptions  Medication Sig Dispense Refill  .  cholecalciferol (VITAMIN D) 1000 UNITS tablet Take 1 tablet (1,000 Units total) by mouth daily. 100 tablet 3  . Cyanocobalamin (VITAMIN B-12) 1000 MCG SUBL Place 1 tablet (1,000 mcg total) under the tongue daily. 100 tablet 3  . misoprostol (CYTOTEC) 200 MCG tablet Take one po tablet the night before procedure and one tablet po the morning of procedure. 2 tablet 0  . NEXIUM 40 MG capsule Take 1 capsule (40 mg total) by mouth daily before breakfast. 90 capsule 3  . norethindrone (MICRONOR,CAMILA,ERRIN) 0.35 MG tablet Take 1 tablet (0.35 mg total) by mouth daily. 3 Package 1   No current facility-administered medications for this visit.     ALLERGIES: Review of patient's allergies indicates no known allergies.  Family History  Problem Relation Age of Onset  . Diabetes Other     1st degree relative  . Other Father     Dec age 83 MVA  . Osteoarthritis Mother   . Hypertension Mother   . Hyperlipidemia Mother   . Stroke Paternal Grandmother     Social History   Social History  . Marital Status: Single    Spouse Name: N/A  . Number of Children: N/A  . Years of Education: N/A   Occupational History  . Not on file.   Social History Main Topics  . Smoking status: Never Smoker   . Smokeless tobacco: Not on file  .  Alcohol Use: 1.8 oz/week    3 Standard drinks or equivalent per week  . Drug Use: No  . Sexual Activity: No   Other Topics Concern  . Not on file   Social History Narrative    ROS:  Pertinent items are noted in HPI.  PHYSICAL EXAMINATION:    LMP 05/13/2013 (Approximate)    General appearance: alert, cooperative and appears stated age    Pelvic: External genitalia:  no lesions              Urethra:  normal appearing urethra with no masses, tenderness or lesions              Bartholins and Skenes: normal                 Vagina: normal appearing vagina with normal color and discharge, no lesions              Cervix: no lesions      Bimanual Exam:  Uterus:   normal size, contour, position, consistency, mobility, non-tender              Adnexa: normal adnexa and no mass, fullness, tenderness           Chaperone was present for exam.  ASSESSMENT  Amenorrhea.  On Micronor for hyperplasia prevention.   Hx elevated prolactin with normal recent level.  Normal MRI of pituitary in 2015.  PLAN  Counseled regarding Micronor and its purpose.  We discussed other alternatives including cyclic Provera and a progesterone only IUD.  Continue Micronor until annual exam in October.  Will stop OCPs 2 weeks prior to visit so we can check her [place] and E2 level.   An After Visit Summary was printed and given to the patient.  ____15__ minutes face to face time of which over 50% was spent in counseling.

## 2015-09-03 ENCOUNTER — Other Ambulatory Visit: Payer: Self-pay | Admitting: *Deleted

## 2015-09-03 MED ORDER — NEXIUM 40 MG PO CPDR: 40.0000 mg | DELAYED_RELEASE_CAPSULE | Freq: Every day | ORAL | Status: DC

## 2015-11-21 ENCOUNTER — Ambulatory Visit (INDEPENDENT_AMBULATORY_CARE_PROVIDER_SITE_OTHER): Payer: Managed Care, Other (non HMO) | Admitting: Internal Medicine

## 2015-11-21 ENCOUNTER — Encounter: Payer: Self-pay | Admitting: Internal Medicine

## 2015-11-21 ENCOUNTER — Other Ambulatory Visit (INDEPENDENT_AMBULATORY_CARE_PROVIDER_SITE_OTHER): Payer: Managed Care, Other (non HMO)

## 2015-11-21 VITALS — BP 110/70 | HR 65 | Ht 66.0 in | Wt 280.0 lb

## 2015-11-21 DIAGNOSIS — M79605 Pain in left leg: Secondary | ICD-10-CM

## 2015-11-21 DIAGNOSIS — M79604 Pain in right leg: Secondary | ICD-10-CM | POA: Diagnosis not present

## 2015-11-21 DIAGNOSIS — Z0000 Encounter for general adult medical examination without abnormal findings: Secondary | ICD-10-CM

## 2015-11-21 DIAGNOSIS — E538 Deficiency of other specified B group vitamins: Secondary | ICD-10-CM

## 2015-11-21 DIAGNOSIS — Z23 Encounter for immunization: Secondary | ICD-10-CM | POA: Diagnosis not present

## 2015-11-21 DIAGNOSIS — K8020 Calculus of gallbladder without cholecystitis without obstruction: Secondary | ICD-10-CM | POA: Diagnosis not present

## 2015-11-21 DIAGNOSIS — E559 Vitamin D deficiency, unspecified: Secondary | ICD-10-CM

## 2015-11-21 LAB — HEPATIC FUNCTION PANEL
ALBUMIN: 4 g/dL (ref 3.5–5.2)
ALT: 12 U/L (ref 0–35)
AST: 14 U/L (ref 0–37)
Alkaline Phosphatase: 71 U/L (ref 39–117)
BILIRUBIN TOTAL: 0.5 mg/dL (ref 0.2–1.2)
Bilirubin, Direct: 0.1 mg/dL (ref 0.0–0.3)
TOTAL PROTEIN: 7.6 g/dL (ref 6.0–8.3)

## 2015-11-21 LAB — LIPID PANEL
CHOLESTEROL: 162 mg/dL (ref 0–200)
HDL: 51.1 mg/dL (ref >39.00)
LDL CALC: 100 mg/dL — AB (ref 0–99)
NonHDL: 110.54
TRIGLYCERIDES: 54 mg/dL (ref 0.0–149.0)
Total CHOL/HDL Ratio: 3
VLDL: 10.8 mg/dL (ref 0.0–40.0)

## 2015-11-21 LAB — URINALYSIS
Bilirubin Urine: NEGATIVE
Hgb urine dipstick: NEGATIVE
KETONES UR: NEGATIVE
Leukocytes, UA: NEGATIVE
Nitrite: NEGATIVE
SPECIFIC GRAVITY, URINE: 1.02 (ref 1.000–1.030)
TOTAL PROTEIN, URINE-UPE24: NEGATIVE
URINE GLUCOSE: NEGATIVE
UROBILINOGEN UA: 2 — AB (ref 0.0–1.0)
pH: 7 (ref 5.0–8.0)

## 2015-11-21 LAB — CBC WITH DIFFERENTIAL/PLATELET
BASOS ABS: 0 10*3/uL (ref 0.0–0.1)
BASOS PCT: 0.4 % (ref 0.0–3.0)
Eosinophils Absolute: 0 10*3/uL (ref 0.0–0.7)
Eosinophils Relative: 0.5 % (ref 0.0–5.0)
HCT: 37.3 % (ref 36.0–46.0)
Hemoglobin: 12.1 g/dL (ref 12.0–15.0)
LYMPHS ABS: 1.6 10*3/uL (ref 0.7–4.0)
Lymphocytes Relative: 25 % (ref 12.0–46.0)
MCHC: 32.5 g/dL (ref 30.0–36.0)
MCV: 71 fl — AB (ref 78.0–100.0)
MONOS PCT: 7.5 % (ref 3.0–12.0)
Monocytes Absolute: 0.5 10*3/uL (ref 0.1–1.0)
NEUTROS ABS: 4.2 10*3/uL (ref 1.4–7.7)
NEUTROS PCT: 66.6 % (ref 43.0–77.0)
PLATELETS: 333 10*3/uL (ref 150.0–400.0)
RBC: 5.25 Mil/uL — ABNORMAL HIGH (ref 3.87–5.11)
RDW: 15.8 % — AB (ref 11.5–15.5)
WBC: 6.3 10*3/uL (ref 4.0–10.5)

## 2015-11-21 LAB — BASIC METABOLIC PANEL
BUN: 10 mg/dL (ref 6–23)
CHLORIDE: 105 meq/L (ref 96–112)
CO2: 29 mEq/L (ref 19–32)
CREATININE: 0.75 mg/dL (ref 0.40–1.20)
Calcium: 9.6 mg/dL (ref 8.4–10.5)
GFR: 103.79 mL/min (ref >60.00)
Glucose, Bld: 95 mg/dL (ref 70–99)
Potassium: 4.1 mEq/L (ref 3.5–5.1)
Sodium: 140 mEq/L (ref 135–145)

## 2015-11-21 LAB — URIC ACID: Uric Acid, Serum: 4.3 mg/dL (ref 2.4–7.0)

## 2015-11-21 LAB — VITAMIN B12

## 2015-11-21 LAB — TSH: TSH: 2.61 u[IU]/mL (ref 0.35–4.50)

## 2015-11-21 LAB — SEDIMENTATION RATE: SED RATE: 43 mm/h — AB (ref 0–30)

## 2015-11-21 MED ORDER — VITAMIN D3 50 MCG (2000 UT) PO CAPS: 2000.0000 [IU] | ORAL_CAPSULE | Freq: Every day | ORAL | 3 refills | Status: DC

## 2015-11-21 NOTE — Assessment & Plan Note (Addendum)
We discussed age appropriate health related issues, including available/recomended screening tests and vaccinations. We discussed a need for adhering to healthy diet and exercise. Labs/EKG were reviewed/ordered. All questions were answered. Colon Dr Mann 1/14 nl GYN Dr Song  

## 2015-11-21 NOTE — Assessment & Plan Note (Signed)
2016 Dr Excell Seltzer - has not had surgery

## 2015-11-21 NOTE — Assessment & Plan Note (Signed)
B knees, feet 2017 due to standing

## 2015-11-21 NOTE — Addendum Note (Signed)
Addended by: Cresenciano Lick on: 11/21/2015 10:02 AM   Modules accepted: Orders

## 2015-11-21 NOTE — Progress Notes (Signed)
Pre visit review using our clinic review tool, if applicable. No additional management support is needed unless otherwise documented below in the visit note. 

## 2015-11-21 NOTE — Assessment & Plan Note (Signed)
On B12 

## 2015-11-21 NOTE — Assessment & Plan Note (Signed)
Labs

## 2015-11-21 NOTE — Progress Notes (Signed)
Subjective:  Patient ID: Brianna Bean, female    DOB: [birth year]  Age: 55 y.o. MRN: [id number]  CC: Annual Exam   HPI Brianna Bean presents for a well exam. Working a lot. C/o leg pain w/standing  Outpatient Medications Prior to Visit  Medication Sig Dispense Refill  . Cyanocobalamin (VITAMIN B-12) 1000 MCG SUBL Place 1 tablet (1,000 mcg total) under the tongue daily. 100 tablet 3  . NEXIUM 40 MG capsule Take 1 capsule (40 mg total) by mouth daily before breakfast. Overdue for yearly physical must see MD for refills (Patient taking differently: Take 40 mg by mouth daily before breakfast. ) 30 capsule 0  . norethindrone (MICRONOR,CAMILA,ERRIN) 0.35 MG tablet Take 1 tablet (0.35 mg total) by mouth daily. 3 Package 2  . misoprostol (CYTOTEC) 200 MCG tablet Take one po tablet the night before procedure and one tablet po the morning of procedure. (Patient not taking: Reported on 11/21/2015) 2 tablet 0   No facility-administered medications prior to visit.     ROS Review of Systems  Constitutional: Negative for activity change, appetite change, chills, fatigue and unexpected weight change.  HENT: Negative for congestion, mouth sores and sinus pressure.   Eyes: Negative for visual disturbance.  Respiratory: Negative for cough and chest tightness.   Gastrointestinal: Negative for abdominal pain and nausea.  Genitourinary: Negative for difficulty urinating, frequency and vaginal pain.  Musculoskeletal: Negative for back pain and gait problem.  Skin: Negative for pallor and rash.  Neurological: Negative for dizziness, tremors, weakness, numbness and headaches.  Psychiatric/Behavioral: Negative for confusion and sleep disturbance.    Objective:  BP 110/70   Pulse 65   Ht 5\' 6"  (1.676 m)   Wt 280 lb (127 kg)   LMP 05/13/2013 (Approximate)   SpO2 96%   BMI 45.19 kg/m   BP Readings from Last 3 Encounters:  11/21/15 110/70  05/19/15 124/76  02/06/15 130/80    Wt Readings from  Last 3 Encounters:  11/21/15 280 lb (127 kg)  05/19/15 291 lb 3.2 oz (132.1 kg)  02/06/15 281 lb (127.5 kg)    Physical Exam  Constitutional: She appears well-developed. No distress.  HENT:  Head: Normocephalic.  Right Ear: External ear normal.  Left Ear: External ear normal.  Nose: Nose normal.  Mouth/Throat: Oropharynx is clear and moist.  Eyes: Conjunctivae are normal. Pupils are equal, round, and reactive to light. Right eye exhibits no discharge. Left eye exhibits no discharge.  Neck: Normal range of motion. Neck supple. No JVD present. No tracheal deviation present. No thyromegaly present.  Cardiovascular: Normal rate, regular rhythm and normal heart sounds.   Pulmonary/Chest: No stridor. No respiratory distress. She has no wheezes.  Abdominal: Soft. Bowel sounds are normal. She exhibits no distension and no mass. There is no tenderness. There is no rebound and no guarding.  Musculoskeletal: She exhibits no edema or tenderness.  Lymphadenopathy:    She has no cervical adenopathy.  Neurological: She displays normal reflexes. No cranial nerve deficit. She exhibits normal muscle tone. Coordination normal.  Skin: No rash noted. No erythema.  Psychiatric: She has a normal mood and affect. Her behavior is normal. Judgment and thought content normal.  Obese  Lab Results  Component Value Date   WBC 5.2 08/12/2014   HGB 12.2 08/12/2014   HCT 37.5 08/12/2014   PLT 314.0 08/12/2014   GLUCOSE 87 08/12/2014   CHOL 189 08/12/2014   TRIG 49.0 08/12/2014   HDL 55.50 08/12/2014   Haines  124 (H) 08/12/2014   ALT 8 08/12/2014   AST 11 08/12/2014   NA 137 08/12/2014   K 4.0 08/12/2014   CL 104 08/12/2014   CREATININE 0.76 08/12/2014   BUN 10 08/12/2014   CO2 28 08/12/2014   TSH 1.28 08/12/2014   HGBA1C 5.6 08/12/2014    US Transvaginal Non-ob  Result Date: 02/06/2015 SEE PROGRESS NOTE  Mm Digital Screening Bilateral  Result Date: 02/07/2015 CLINICAL DATA:  Screening.  EXAM: DIGITAL SCREENING BILATERAL MAMMOGRAM WITH CAD COMPARISON:  Previous exam(s). ACR Breast Density Category b: There are scattered areas of fibroglandular density. FINDINGS: There are no findings suspicious for malignancy. Images were processed with CAD. IMPRESSION: No mammographic evidence of malignancy. A result letter of this screening mammogram will be mailed directly to the patient. RECOMMENDATION: Screening mammogram in one year. (Code:SM-B-01Y) BI-RADS CATEGORY  1: Negative. Electronically Signed   By: Lajean Manes M.D.   On: 02/07/2015 10:51    Assessment & Plan:   There are no diagnoses linked to this encounter. I am having Ms. Husak maintain her Vitamin B-12, misoprostol, norethindrone, and NEXIUM.  No orders of the defined types were placed in this encounter.    Follow-up: No Follow-up on file.  Walker Kehr, MD

## 2015-11-21 NOTE — Assessment & Plan Note (Signed)
Vit D 

## 2015-11-22 LAB — HEPATITIS C ANTIBODY: HCV Ab: NEGATIVE

## 2016-01-22 ENCOUNTER — Other Ambulatory Visit: Payer: Self-pay | Admitting: Obstetrics and Gynecology

## 2016-01-22 DIAGNOSIS — Z1231 Encounter for screening mammogram for malignant neoplasm of breast: Secondary | ICD-10-CM

## 2016-02-05 NOTE — Progress Notes (Signed)
55 y.o. G0P0000 Single African American female here for annual exam.    Currently on Camilla and has no menses. Amenorrhea last year prompted evaluation.  Labs 01/22/15 - FSH 15, E2 45.4. Pelvic US 02/06/15: Uterus - 0.84 cm fibroid. EMS - 4.70 mm. No abnormal blood flow.  Ovaries - normal. Free fluid - no No EMB performed.  Has a hx of elevated prolactin in past and has yearly prolactin check.  Had negative MRI of brain in [place].  MRI here in 2002 also negative for microadenoma.   Wants to loose weight.  Had lap band adjusted last in 2014. Plans to see Dr. Excell Seltzer.   Still lives and works in [place].  Mother is 15 and lives independently in [place] area.  PCP:   Bella Kennedy, MD  Patient's last menstrual period was 05/13/2013 (approximate).       Sexually active: No.  Female.  The current method of family planning is post menopausal status.    Exercising: No.   Smoker:  no  Health Maintenance: Pap:  01/2014 History of abnormal Pap:  no MMG: 02-07-15 Density B/Neg/BiRads1:[facility] to have in [place]. Colonoscopy:  05/2012 - Dr. Collene Mares - normal BMD:   n/a  Result  n/a TDaP:  11-21-15 Gardasil:   N/A  Screening Labs:  Hb today: PCP, Urine today: Neg   reports that she has never smoked. She has never used smokeless tobacco. She reports that she drinks about 0.6 oz of alcohol per week . She reports that she does not use drugs.  Past Medical History:  Diagnosis Date  . ALLERGIC RHINITIS 02/15/2008  . Fibroids 06/21/13   U/S [place] - 2 fibroids less than 1 cm  . Gallstones   . GERD 02/15/2008  . 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 OBESITY, MORBID 02/15/2008    Past Surgical History:  Procedure Laterality Date  . LAPAROSCOPIC GASTRIC BANDING  2006   --Dr. Excell Seltzer    Current Outpatient Prescriptions  Medication Sig Dispense Refill  . Cholecalciferol (VITAMIN D3) 2000 units capsule Take 1 capsule (2,000 Units total) by mouth daily. 100 capsule 3  .  Cyanocobalamin (VITAMIN B-12) 1000 MCG SUBL Place 1 tablet (1,000 mcg total) under the tongue daily. 100 tablet 3  . NEXIUM 40 MG capsule Take 1 capsule (40 mg total) by mouth daily before breakfast. Overdue for yearly physical must see MD for refills (Patient taking differently: Take 40 mg by mouth daily before breakfast. ) 30 capsule 0  . norethindrone (MICRONOR,CAMILA,ERRIN) 0.35 MG tablet Take 1 tablet (0.35 mg total) by mouth daily. 3 Package 2   No current facility-administered medications for this visit.     Family History  Problem Relation Age of Onset  . Other Father     Dec age 22 MVA  . Osteoarthritis Mother   . Hypertension Mother   . Hyperlipidemia Mother   . Diabetes Other     1st degree relative  . Stroke Paternal Grandmother     ROS:  Pertinent items are noted in HPI.  Otherwise, a comprehensive ROS was negative.  Exam:   BP 108/70 (BP Location: Right Arm, Patient Position: Sitting, Cuff Size: Large)   Pulse 76   Resp 20   Ht 5' 5.5" (1.664 m)   Wt 278 lb 9.6 oz (126.4 kg)   LMP 05/13/2013 (Approximate)   BMI 45.66 kg/m     General appearance: alert, cooperative and appears stated age Head: Normocephalic, without obvious abnormality, atraumatic Neck: no adenopathy, supple,  symmetrical, trachea midline and thyroid normal to inspection and palpation Lungs: clear to auscultation bilaterally Breasts: normal appearance, no masses or tenderness, No nipple retraction or dimpling, No nipple discharge or bleeding, No axillary or supraclavicular adenopathy Heart: regular rate and rhythm Abdomen: lap band palpable in the right upper abdomen, soft, non-tender; no masses, no organomegaly Extremities: extremities normal, atraumatic, no cyanosis or edema Skin: Skin color, texture, turgor normal. No rashes or lesions Lymph nodes: Cervical, supraclavicular, and axillary nodes normal. No abnormal inguinal nodes palpated Neurologic: Grossly normal  Pelvic: External genitalia:   no lesions              Urethra:  normal appearing urethra with no masses, tenderness or lesions              Bartholins and Skenes: normal                 Vagina: normal appearing vagina with normal color and discharge, no lesions              Cervix: no lesions              Pap taken: No. Bimanual Exam:  Uterus:  normal size, contour, position, consistency, mobility, non-tender              Adnexa: no mass, fullness, tenderness              Rectal exam: Yes.  .  Confirms.              Anus:  normal sphincter tone, no lesions  Chaperone was present for exam.  Assessment:   Well woman visit with normal exam. Amenorrhea.   Thin endometrium on pelvic ultrasound in 2016. Obesity.  Hx elevated prolactin. She has had negative imaging of brain.   Plan: Yearly mammogram recommended after age 89.  She will do in [place]. Recommended self breast exam.  Pap and HR HPV as above. Discussed Calcium, Vitamin D, regular exercise program including cardiovascular and weight bearing exercise. Will have patient stop Micronor for 2 weeks and then measure FSH, estradiol, prolactin. She travels to [place] often, so this is not a hardship for her to do.   My plan is to continue her on the Micronor for endometrial protection against hyperplasia.  She is in agreement. Routine labs with PCP.  Follow up annually and prn.       After visit summary provided.

## 2016-02-06 ENCOUNTER — Encounter: Payer: Self-pay | Admitting: Obstetrics and Gynecology

## 2016-02-06 ENCOUNTER — Ambulatory Visit (INDEPENDENT_AMBULATORY_CARE_PROVIDER_SITE_OTHER): Payer: Managed Care, Other (non HMO) | Admitting: Obstetrics and Gynecology

## 2016-02-06 VITALS — BP 108/70 | HR 76 | Resp 20 | Ht 65.5 in | Wt 278.6 lb

## 2016-02-06 DIAGNOSIS — N912 Amenorrhea, unspecified: Secondary | ICD-10-CM

## 2016-02-06 DIAGNOSIS — Z0000 Encounter for general adult medical examination without abnormal findings: Secondary | ICD-10-CM | POA: Diagnosis not present

## 2016-02-06 DIAGNOSIS — Z01419 Encounter for gynecological examination (general) (routine) without abnormal findings: Secondary | ICD-10-CM | POA: Diagnosis not present

## 2016-02-06 LAB — POCT URINALYSIS DIPSTICK
BILIRUBIN UA: NEGATIVE
Blood, UA: NEGATIVE
Glucose, UA: NEGATIVE
Ketones, UA: NEGATIVE
LEUKOCYTES UA: NEGATIVE
NITRITE UA: NEGATIVE
PH UA: 5
PROTEIN UA: NEGATIVE
Urobilinogen, UA: NEGATIVE

## 2016-02-06 MED ORDER — NORETHINDRONE 0.35 MG PO TABS: 1.0000 | ORAL_TABLET | Freq: Every day | ORAL | 3 refills | Status: DC

## 2016-02-06 NOTE — Patient Instructions (Signed)

## 2016-02-08 ENCOUNTER — Encounter: Payer: Self-pay | Admitting: Obstetrics and Gynecology

## 2016-02-12 ENCOUNTER — Ambulatory Visit: Payer: Managed Care, Other (non HMO)

## 2016-10-26 ENCOUNTER — Other Ambulatory Visit: Payer: Self-pay | Admitting: General Surgery

## 2016-10-26 DIAGNOSIS — K224 Dyskinesia of esophagus: Secondary | ICD-10-CM

## 2016-11-05 ENCOUNTER — Ambulatory Visit
Admission: RE | Admit: 2016-11-05 | Discharge: 2016-11-05 | Disposition: A | Discharge status: Home / Self Care | Payer: Managed Care, Other (non HMO) | Source: Ambulatory Visit | Attending: General Surgery | Admitting: General Surgery

## 2016-11-05 DIAGNOSIS — K224 Dyskinesia of esophagus: Secondary | ICD-10-CM

## 2016-11-23 ENCOUNTER — Other Ambulatory Visit: Payer: Self-pay | Admitting: General Surgery

## 2016-11-23 DIAGNOSIS — K224 Dyskinesia of esophagus: Secondary | ICD-10-CM

## 2016-12-31 ENCOUNTER — Ambulatory Visit
Admission: RE | Admit: 2016-12-31 | Discharge: 2016-12-31 | Disposition: A | Discharge status: Home / Self Care | Payer: Managed Care, Other (non HMO) | Source: Ambulatory Visit | Attending: General Surgery | Admitting: General Surgery

## 2016-12-31 DIAGNOSIS — K224 Dyskinesia of esophagus: Secondary | ICD-10-CM

## 2017-01-17 ENCOUNTER — Other Ambulatory Visit: Payer: Self-pay | Admitting: Obstetrics and Gynecology

## 2017-01-17 NOTE — Telephone Encounter (Signed)
Medication refill request: OCP  Last AEX:  02-06-16  Next AEX: 02-11-17  Last MMG (if hormonal medication request): 07-16-16 WNL  Refill authorized: please advise

## 2017-02-10 NOTE — Progress Notes (Signed)
56 y.o. G0P0000 Single African American female here for annual exam.    On Micronor.  No spotting or bleeding.  Feels hot at night.   Just had her lap band fluid taken out due to some swallowing issues. Seeing Dr. Excell Seltzer.   ROS - Sinusitis, bloating, depression (states this is more getting more frustrated and angry but not really depressed. Not suicidal.)  SOC - works in Aeronautical engineer, works in Therapist, art at Geophysicist/field seismologist also.  PCP:   Dr. Alain Marion  Patient's last menstrual period was 05/13/2013 (approximate).         Sexually active: No.  The current method of family planning is post menopausal status.    Exercising: No.  The patient does not participate in regular exercise at present. Smoker:  no  Health Maintenance: Pap: 01/2014 Neg, 01-10-10 Neg History of abnormal Pap:  no MMG: 07-16-16 3D Density A/Neg/BiRads2:[facility] Colonoscopy:   05/2012 - Dr. Collene Mares - normal BMD:   n/a  Result  n/a TDaP:  11-21-15 Gardasil:   no HIV: never Hep C: 11/21/15 Negative Screening Labs:  Labs drawn today   reports that she has never smoked. She has never used smokeless tobacco. She reports that she drinks about 0.6 oz of alcohol per week . She reports that she does not use drugs.  Past Medical History:  Diagnosis Date  . ALLERGIC RHINITIS 02/15/2008  . Fibroids 06/21/13   U/S [place] - 2 fibroids less than 1 cm  . Gallstones   . GERD 02/15/2008  . 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 OBESITY, MORBID 02/15/2008    Past Surgical History:  Procedure Laterality Date  . LAPAROSCOPIC GASTRIC BANDING  2006   --Dr. Excell Seltzer    Current Outpatient Prescriptions  Medication Sig Dispense Refill  . Cholecalciferol (VITAMIN D3) 2000 units capsule Take 1 capsule (2,000 Units total) by mouth daily. 100 capsule 3  . Cyanocobalamin (VITAMIN B-12) 1000 MCG SUBL Place 1 tablet (1,000 mcg total) under the tongue daily. 100 tablet 3  . HEATHER 0.35 MG tablet TAKE 1 TABLET DAILY 84  tablet 0  . NEXIUM 40 MG capsule Take 1 capsule (40 mg total) by mouth daily before breakfast. Overdue for yearly physical must see MD for refills (Patient taking differently: Take 40 mg by mouth daily before breakfast. ) 30 capsule 0   No current facility-administered medications for this visit.     Family History  Problem Relation Age of Onset  . Other Father        Dec age 19 MVA  . Osteoarthritis Mother   . Hypertension Mother   . Hyperlipidemia Mother   . Diabetes Other        1st degree relative  . Stroke Paternal Grandmother     ROS:  Pertinent items are noted in HPI.  Otherwise, a comprehensive ROS was negative.  Exam:   BP 120/70 (BP Location: Right Arm, Patient Position: Sitting, Cuff Size: Large)   Pulse 72   Resp 16   Ht 5\' 6"  (1.676 m)   Wt 298 lb (135.2 kg)   LMP 05/13/2013 (Approximate)   BMI 48.10 kg/m     General appearance: alert, cooperative and appears stated age Head: Normocephalic, without obvious abnormality, atraumatic Neck: no adenopathy, supple, symmetrical, trachea midline and thyroid normal to inspection and palpation Lungs: clear to auscultation bilaterally Breasts: normal appearance, no masses or tenderness, No nipple retraction or dimpling, No nipple discharge or bleeding, No axillary or supraclavicular adenopathy Heart: regular  rate and rhythm Abdomen: soft, non-tender; no masses, no organomegaly Extremities: extremities normal, atraumatic, no cyanosis or edema Skin: Skin color, texture, turgor normal. No rashes or lesions Lymph nodes: Cervical, supraclavicular, and axillary nodes normal. No abnormal inguinal nodes palpated Neurologic: Grossly normal  Pelvic: External genitalia:  no lesions              Urethra:  normal appearing urethra with no masses, tenderness or lesions              Bartholins and Skenes: normal                 Vagina: normal appearing vagina with normal color and discharge, no lesions              Cervix: no  lesions              Pap taken: Yes.   Bimanual Exam:  Uterus:  normal size, contour, position, consistency, mobility, non-tender              Adnexa: no mass, fullness, tenderness              Rectal exam: Yes.  .  Confirms.              Anus:  normal sphincter tone, no lesions  Chaperone was present for exam.  Assessment:   Well woman visit with normal exam. Amenorrhea.   Thin endometrium on pelvic ultrasound in 2016.  On Micronor. Obesity.  Had lap band. Hx elevated prolactin. She has had negative imaging of brain.   Plan: Mammogram screening discussed. Recommended self breast awareness. Pap and HR HPV as above. Guidelines for Calcium, Vitamin D, regular exercise program including cardiovascular and weight bearing exercise. Routine labs, FSH, estradiol, and prolactin. Refill of Micronor for one year (Just refilled 90 day Rx.)  We talked about contraceptive and noncontraceptive benefits.  We talked about stress relief through exercise, yoga, and counseling.  She has access to counseling at work.  Follow up annually and prn.   After visit summary provided.

## 2017-02-11 ENCOUNTER — Other Ambulatory Visit (HOSPITAL_COMMUNITY)
Admission: RE | Admit: 2017-02-11 | Discharge: 2017-02-11 | Disposition: A | Discharge status: Home / Self Care | Payer: Managed Care, Other (non HMO) | Source: Ambulatory Visit | Attending: Obstetrics and Gynecology | Admitting: Obstetrics and Gynecology

## 2017-02-11 ENCOUNTER — Encounter: Payer: Self-pay | Admitting: Obstetrics and Gynecology

## 2017-02-11 ENCOUNTER — Ambulatory Visit (INDEPENDENT_AMBULATORY_CARE_PROVIDER_SITE_OTHER): Payer: Managed Care, Other (non HMO) | Admitting: Obstetrics and Gynecology

## 2017-02-11 VITALS — BP 120/70 | HR 72 | Resp 16 | Ht 66.0 in | Wt 298.0 lb

## 2017-02-11 DIAGNOSIS — Z79899 Other long term (current) drug therapy: Secondary | ICD-10-CM | POA: Insufficient documentation

## 2017-02-11 DIAGNOSIS — Z8249 Family history of ischemic heart disease and other diseases of the circulatory system: Secondary | ICD-10-CM | POA: Insufficient documentation

## 2017-02-11 DIAGNOSIS — Z8261 Family history of arthritis: Secondary | ICD-10-CM | POA: Insufficient documentation

## 2017-02-11 DIAGNOSIS — Z6841 Body Mass Index (BMI) 40.0 and over, adult: Secondary | ICD-10-CM | POA: Insufficient documentation

## 2017-02-11 DIAGNOSIS — E669 Obesity, unspecified: Secondary | ICD-10-CM | POA: Diagnosis not present

## 2017-02-11 DIAGNOSIS — K219 Gastro-esophageal reflux disease without esophagitis: Secondary | ICD-10-CM | POA: Insufficient documentation

## 2017-02-11 DIAGNOSIS — Z01419 Encounter for gynecological examination (general) (routine) without abnormal findings: Secondary | ICD-10-CM | POA: Diagnosis not present

## 2017-02-11 DIAGNOSIS — Z1151 Encounter for screening for human papillomavirus (HPV): Secondary | ICD-10-CM | POA: Diagnosis not present

## 2017-02-11 DIAGNOSIS — Z9884 Bariatric surgery status: Secondary | ICD-10-CM | POA: Diagnosis not present

## 2017-02-11 DIAGNOSIS — N912 Amenorrhea, unspecified: Secondary | ICD-10-CM | POA: Diagnosis not present

## 2017-02-11 MED ORDER — NORETHINDRONE 0.35 MG PO TABS: 1.0000 | ORAL_TABLET | Freq: Every day | ORAL | 2 refills | Status: DC

## 2017-02-11 NOTE — Addendum Note (Signed)
Addended by: Reesa Chew E on: 02/11/2017 03:50 PM   Modules accepted: Orders

## 2017-02-11 NOTE — Addendum Note (Signed)
Addended by: Gwendlyn Deutscher on: 02/11/2017 03:52 PM   Modules accepted: Orders

## 2017-02-11 NOTE — Patient Instructions (Signed)

## 2017-02-12 LAB — LIPID PANEL
CHOL/HDL RATIO: 3 ratio (ref 0.0–4.4)
Cholesterol, Total: 167 mg/dL (ref 100–199)
HDL: 56 mg/dL (ref >39)
LDL Calculated: 102 mg/dL — ABNORMAL HIGH (ref 0–99)
TRIGLYCERIDES: 45 mg/dL (ref 0–149)
VLDL CHOLESTEROL CAL: 9 mg/dL (ref 5–40)

## 2017-02-12 LAB — CBC
Hematocrit: 34.7 % (ref 34.0–46.6)
Hemoglobin: 11.2 g/dL (ref 11.1–15.9)
MCH: 23.4 pg — ABNORMAL LOW (ref 26.6–33.0)
MCHC: 32.3 g/dL (ref 31.5–35.7)
MCV: 72 fL — AB (ref 79–97)
PLATELETS: 312 10*3/uL (ref 150–379)
RBC: 4.79 x10E6/uL (ref 3.77–5.28)
RDW: 15.8 % — AB (ref 12.3–15.4)
WBC: 4.7 10*3/uL (ref 3.4–10.8)

## 2017-02-12 LAB — PROLACTIN: PROLACTIN: 26.1 ng/mL — AB (ref 4.8–23.3)

## 2017-02-12 LAB — COMPREHENSIVE METABOLIC PANEL
A/G RATIO: 1.3 (ref 1.2–2.2)
ALBUMIN: 3.8 g/dL (ref 3.5–5.5)
ALK PHOS: 84 IU/L (ref 39–117)
ALT: 18 IU/L (ref 0–32)
AST: 16 IU/L (ref 0–40)
BILIRUBIN TOTAL: 0.4 mg/dL (ref 0.0–1.2)
BUN / CREAT RATIO: 19 (ref 9–23)
BUN: 13 mg/dL (ref 6–24)
CO2: 24 mmol/L (ref 20–29)
CREATININE: 0.68 mg/dL (ref 0.57–1.00)
Calcium: 8.8 mg/dL (ref 8.7–10.2)
Chloride: 102 mmol/L (ref 96–106)
GFR calc Af Amer: 115 mL/min/{1.73_m2} (ref >59)
GFR calc non Af Amer: 99 mL/min/{1.73_m2} (ref >59)
GLOBULIN, TOTAL: 3 g/dL (ref 1.5–4.5)
Glucose: 88 mg/dL (ref 65–99)
POTASSIUM: 4.4 mmol/L (ref 3.5–5.2)
SODIUM: 140 mmol/L (ref 134–144)
Total Protein: 6.8 g/dL (ref 6.0–8.5)

## 2017-02-12 LAB — TSH: TSH: 2.08 u[IU]/mL (ref 0.450–4.500)

## 2017-02-12 LAB — ESTRADIOL: ESTRADIOL: 13.1 pg/mL

## 2017-02-12 LAB — FOLLICLE STIMULATING HORMONE: FSH: 31.1 m[IU]/mL

## 2017-02-14 LAB — CYTOLOGY - PAP
DIAGNOSIS: NEGATIVE
HPV (WINDOPATH): NOT DETECTED

## 2017-02-23 LAB — IRON: IRON: 90 ug/dL (ref 27–159)

## 2017-02-23 LAB — SPECIMEN STATUS REPORT

## 2017-02-23 LAB — FERRITIN: Ferritin: 48 ng/mL (ref 15–150)

## 2017-03-21 ENCOUNTER — Other Ambulatory Visit: Payer: Self-pay | Admitting: Obstetrics and Gynecology

## 2017-03-30 MED ORDER — NORETHINDRONE 0.35 MG PO TABS: 1.0000 | ORAL_TABLET | Freq: Every day | ORAL | 2 refills | Status: DC

## 2017-03-30 NOTE — Telephone Encounter (Signed)
Patient calling to check the status of a refill on her birth control.

## 2017-03-30 NOTE — Telephone Encounter (Signed)
Spoke with patient- [facility]. Sent RX to [facility]

## 2017-11-24 ENCOUNTER — Encounter: Payer: Managed Care, Other (non HMO) | Admitting: Internal Medicine

## 2017-12-20 ENCOUNTER — Other Ambulatory Visit: Payer: Self-pay | Admitting: Obstetrics and Gynecology

## 2017-12-20 NOTE — Telephone Encounter (Signed)
Medication refill request: norethindrone  Last AEX:  02-11-17 BS Next AEX: 03-03-18  Last MMG (if hormonal medication request): 07-28-16 BIRADS 2 benign  Refill authorized: 03-30-17 #84, 2 RF. Today, please advise.    Medication pended for #84, 0RF.

## 2017-12-20 NOTE — Telephone Encounter (Signed)
Patient returned call and confirmed her pharmacy is [facility].

## 2017-12-20 NOTE — Telephone Encounter (Signed)
Message left to return call to [facility] at [phone].   Need patient to confirm pharmacy.

## 2017-12-21 NOTE — Telephone Encounter (Signed)
Please contact patient to get her mammogram up to date.  I will refill her pills after she sends reports through to me.

## 2017-12-22 ENCOUNTER — Other Ambulatory Visit: Payer: Self-pay | Admitting: *Deleted

## 2017-12-22 MED ORDER — NORETHINDRONE 0.35 MG PO TABS: 1.0000 | ORAL_TABLET | Freq: Every day | ORAL | 0 refills | Status: DC

## 2017-12-22 NOTE — Telephone Encounter (Signed)
Spoke with patient. MMG was 12-02-17 at [facility]. In Care Everywhere. BIRADS 1 negative. See new refill encounter dated 12-22-17.

## 2017-12-22 NOTE — Telephone Encounter (Signed)
Medication refill request: norethindrone Last AEX:  02-11-17 BS Next AEX: 03-03-18  Last MMG (if hormonal medication request): 12-02-17 BIRADS 1 negative  Refill authorized: today please advise.   Spoke with patient. Confirmed MMG in August at [facility]. MMG report in Care Everywhere. Pharmacy confirmed as [facility].  Medication pended for #84, 0RF to [facility].

## 2017-12-27 ENCOUNTER — Encounter: Payer: Self-pay | Admitting: Obstetrics and Gynecology

## 2018-03-03 ENCOUNTER — Ambulatory Visit: Payer: Managed Care, Other (non HMO) | Admitting: Obstetrics and Gynecology

## 2018-03-06 ENCOUNTER — Ambulatory Visit: Payer: Managed Care, Other (non HMO) | Admitting: Obstetrics and Gynecology

## 2018-03-24 NOTE — Progress Notes (Signed)
57 y.o. G0P0000 Single African American female here for annual exam.    No vaginal bleeding.  Taking Micronor every day.  No hot flashes or night sweats.   FSH 31.1 and E2 13.1 on 02/11/17.   Having weight gain and had to have banding removed.  Seeing Dr. Excell Seltzer actively as a patient and has follow up in Jan. 2020.  Feels depressed about her job.  Denies suicidal ideation.   PCP: Bella Kennedy, MD    Patient's last menstrual period was 05/13/2013 (approximate).           Sexually active: No.  The current method of family planning is post menopausal status.    Exercising: No.  The patient does not participate in regular exercise at present. Smoker:  no  Health Maintenance: Pap: 02-11-17 Neg:Neg HR HPV, 01-10-14 Neg History of abnormal Pap:  no MMG: 11-29-17 3D/Neg/BiRads1--[facility] Colonoscopy:  05/2012 normal;next 10 years BMD:   n/a  Result  n/a TDaP:  11-21-15 Gardasil:   no HIV: never Hep C: 11-21-15 Neg Screening Labs:  Hb today: PCP   reports that she has never smoked. She has never used smokeless tobacco. She reports current alcohol use of about 3.0 standard drinks of alcohol per week. She reports that she does not use drugs.  Past Medical History:  Diagnosis Date  . ALLERGIC RHINITIS 02/15/2008  . Fibroids 06/21/13   U/S [place] - 2 fibroids less than 1 cm  . Gallstones   . GERD 02/15/2008  . 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 OBESITY, MORBID 02/15/2008    Past Surgical History:  Procedure Laterality Date  . LAPAROSCOPIC GASTRIC BANDING  2006   --Dr. Excell Seltzer    Current Outpatient Medications  Medication Sig Dispense Refill  . Cholecalciferol (VITAMIN D3) 2000 units capsule Take 1 capsule (2,000 Units total) by mouth daily. 100 capsule 3  . Cyanocobalamin (VITAMIN B-12) 1000 MCG SUBL Place 1 tablet (1,000 mcg total) under the tongue daily. 100 tablet 3  . NEXIUM 40 MG capsule Take 1 capsule (40 mg total) by mouth daily before breakfast. Overdue for yearly  physical must see MD for refills (Patient taking differently: Take 40 mg by mouth daily before breakfast. ) 30 capsule 0  . norethindrone (HEATHER) 0.35 MG tablet Take 1 tablet (0.35 mg total) by mouth daily. 84 tablet 0   No current facility-administered medications for this visit.     Family History  Problem Relation Age of Onset  . Other Father        Dec age 24 MVA  . Osteoarthritis Mother   . Hypertension Mother   . Hyperlipidemia Mother   . Diabetes Other        1st degree relative  . Stroke Paternal Grandmother     Review of Systems  Constitutional: Positive for unexpected weight change (70 lb weight gain).  Gastrointestinal:       Abd bloating  Psychiatric/Behavioral:       Depression    Exam:   BP 120/80 (BP Location: Right Arm, Patient Position: Sitting, Cuff Size: Large)   Pulse (!) 56   Resp 16   Ht 5\' 6"  (1.676 m)   Wt (!) 332 lb (150.6 kg)   LMP 05/13/2013 (Approximate)   BMI 53.59 kg/m     General appearance: alert, cooperative and appears stated age Head: Normocephalic, without obvious abnormality, atraumatic Neck: no adenopathy, supple, symmetrical, trachea midline and thyroid normal to inspection and palpation Lungs: clear to auscultation bilaterally Breasts: normal appearance,  no masses or tenderness, No nipple retraction or dimpling, No nipple discharge or bleeding, No axillary or supraclavicular adenopathy Heart: regular rate and rhythm Abdomen: soft, non-tender; no masses, no organomegaly Extremities: extremities normal, atraumatic, no cyanosis or edema Skin: Skin color, texture, turgor normal. No rashes or lesions Lymph nodes: Cervical, supraclavicular, and axillary nodes normal. No abnormal inguinal nodes palpated Neurologic: Grossly normal  Pelvic: External genitalia:  no lesions              Urethra:  normal appearing urethra with no masses, tenderness or lesions              Bartholins and Skenes: normal                 Vagina: normal  appearing vagina with normal color and discharge, no lesions              Cervix: no lesions              Pap taken: No. Bimanual Exam:  Uterus:  normal size, contour, position, consistency, mobility, non-tender              Adnexa: no mass, fullness, tenderness              Rectal exam: Yes.  .  Confirms.              Anus:  normal sphincter tone, no lesions  Chaperone was present for exam.  Assessment:   Well woman visit with normal exam. Amenorrhea. Thin endometrium on pelvic ultrasound in 2016.  On Micronor. Obesity.  Hx lap band. Hx elevated prolactin. She has had negative imaging of brain.   Plan: Mammogram screening. Recommended self breast awareness. Pap and HR HPV as above. Guidelines for Calcium, Vitamin D, regular exercise program including cardiovascular and weight bearing exercise. Return for routine labs, prolactin, FSH and estradiol.  She will be off Micronor at least 2 weeks prior to doing her hormonal testing.  Follow up annually and prn.   After visit summary provided.

## 2018-03-27 ENCOUNTER — Other Ambulatory Visit: Payer: Self-pay

## 2018-03-27 ENCOUNTER — Encounter: Payer: Self-pay | Admitting: Obstetrics and Gynecology

## 2018-03-27 ENCOUNTER — Ambulatory Visit (INDEPENDENT_AMBULATORY_CARE_PROVIDER_SITE_OTHER): Payer: Managed Care, Other (non HMO) | Admitting: Obstetrics and Gynecology

## 2018-03-27 VITALS — BP 120/80 | HR 56 | Resp 16 | Ht 66.0 in | Wt 332.0 lb

## 2018-03-27 DIAGNOSIS — N912 Amenorrhea, unspecified: Secondary | ICD-10-CM | POA: Diagnosis not present

## 2018-03-27 DIAGNOSIS — E221 Hyperprolactinemia: Secondary | ICD-10-CM

## 2018-03-27 DIAGNOSIS — Z01419 Encounter for gynecological examination (general) (routine) without abnormal findings: Secondary | ICD-10-CM | POA: Diagnosis not present

## 2018-03-27 NOTE — Patient Instructions (Signed)

## 2018-04-12 DIAGNOSIS — R7989 Other specified abnormal findings of blood chemistry: Secondary | ICD-10-CM

## 2018-04-12 HISTORY — DX: Other specified abnormal findings of blood chemistry: R79.89

## 2018-04-21 ENCOUNTER — Other Ambulatory Visit: Payer: Managed Care, Other (non HMO)

## 2018-04-25 ENCOUNTER — Other Ambulatory Visit (INDEPENDENT_AMBULATORY_CARE_PROVIDER_SITE_OTHER): Payer: BLUE CROSS/BLUE SHIELD

## 2018-04-25 DIAGNOSIS — N912 Amenorrhea, unspecified: Secondary | ICD-10-CM | POA: Diagnosis not present

## 2018-04-25 DIAGNOSIS — Z01419 Encounter for gynecological examination (general) (routine) without abnormal findings: Secondary | ICD-10-CM | POA: Diagnosis not present

## 2018-04-25 DIAGNOSIS — Z0000 Encounter for general adult medical examination without abnormal findings: Secondary | ICD-10-CM

## 2018-04-25 DIAGNOSIS — R7989 Other specified abnormal findings of blood chemistry: Secondary | ICD-10-CM

## 2018-04-25 DIAGNOSIS — E221 Hyperprolactinemia: Secondary | ICD-10-CM

## 2018-04-26 LAB — CBC
Hematocrit: 38.7 % (ref 34.0–46.6)
Hemoglobin: 12.2 g/dL (ref 11.1–15.9)
MCH: 23.3 pg — ABNORMAL LOW (ref 26.6–33.0)
MCHC: 31.5 g/dL (ref 31.5–35.7)
MCV: 74 fL — ABNORMAL LOW (ref 79–97)
Platelets: 246 10*3/uL (ref 150–450)
RBC: 5.23 x10E6/uL (ref 3.77–5.28)
RDW: 14.8 % (ref 11.7–15.4)
WBC: 4.7 10*3/uL (ref 3.4–10.8)

## 2018-04-26 LAB — COMPREHENSIVE METABOLIC PANEL
ALT: 20 IU/L (ref 0–32)
AST: 16 IU/L (ref 0–40)
Albumin/Globulin Ratio: 1.2 (ref 1.2–2.2)
Albumin: 3.8 g/dL (ref 3.5–5.5)
Alkaline Phosphatase: 87 IU/L (ref 39–117)
BUN/Creatinine Ratio: 15 (ref 9–23)
BUN: 11 mg/dL (ref 6–24)
Bilirubin Total: 0.3 mg/dL (ref 0.0–1.2)
CO2: 24 mmol/L (ref 20–29)
Calcium: 9.3 mg/dL (ref 8.7–10.2)
Chloride: 105 mmol/L (ref 96–106)
Creatinine, Ser: 0.72 mg/dL (ref 0.57–1.00)
GFR calc Af Amer: 109 mL/min/{1.73_m2} (ref >59)
GFR calc non Af Amer: 95 mL/min/{1.73_m2} (ref >59)
Globulin, Total: 3.1 g/dL (ref 1.5–4.5)
Glucose: 96 mg/dL (ref 65–99)
Potassium: 4.6 mmol/L (ref 3.5–5.2)
Sodium: 143 mmol/L (ref 134–144)
Total Protein: 6.9 g/dL (ref 6.0–8.5)

## 2018-04-26 LAB — LIPID PANEL
Chol/HDL Ratio: 3.1 ratio (ref 0.0–4.4)
Cholesterol, Total: 185 mg/dL (ref 100–199)
HDL: 60 mg/dL (ref >39)
LDL Calculated: 116 mg/dL — ABNORMAL HIGH (ref 0–99)
Triglycerides: 46 mg/dL (ref 0–149)
VLDL Cholesterol Cal: 9 mg/dL (ref 5–40)

## 2018-04-26 LAB — TSH: TSH: 3.09 u[IU]/mL (ref 0.450–4.500)

## 2018-04-26 LAB — ESTRADIOL: Estradiol: 23.9 pg/mL

## 2018-04-26 LAB — FOLLICLE STIMULATING HORMONE: FSH: 34 m[IU]/mL

## 2018-04-26 LAB — PROLACTIN: Prolactin: 32.8 ng/mL — ABNORMAL HIGH (ref 4.8–23.3)

## 2018-04-26 LAB — VITAMIN D 25 HYDROXY (VIT D DEFICIENCY, FRACTURES): VIT D 25 HYDROXY: 7.8 ng/mL — AB (ref 30.0–100.0)

## 2018-05-01 ENCOUNTER — Telehealth: Payer: Self-pay

## 2018-05-01 MED ORDER — VITAMIN D (ERGOCALCIFEROL) 1.25 MG (50000 UNIT) PO CAPS: 50000.0000 [IU] | ORAL_CAPSULE | ORAL | 0 refills | Status: DC

## 2018-05-01 MED ORDER — NORETHINDRONE 0.35 MG PO TABS: 1.0000 | ORAL_TABLET | Freq: Every day | ORAL | 4 refills | Status: DC

## 2018-05-01 NOTE — Telephone Encounter (Signed)
-----   Message from Nunzio Cobbs, MD sent at 04/30/2018 10:49 AM EST ----- Please contact patient with results of her testing.  Her vit D is very low, and I am recommending she take vit D3 50,000 IU weekly for 3 months and then retest.  Please send to her pharmacy.  Her prolactin level went up to 32.8, which is elevated from 26.1 last year.  I recommend this is also rechecked in 3 months with a lab visit.  This will need to be a fasting lab.   Her LDL cholesterol was minimally elevated, but the ratios of cholesterol overall are in an acceptable range.  This is quite stable for her.   Her blood chemistries, thyroid, and blood counts are all normal.  Her FSH and estradiol levels look like she is perimenopausal. She can continue the Micronor for another year until her next annual exam. Please send refills to her pharmacy.

## 2018-05-01 NOTE — Telephone Encounter (Signed)
Spoke with patient. Results given. Patient verbalizes understanding. Rx for Vitamin D 50,000 IU weekly #12 0RF and Micronor #3 4RF sent to [facility] on file. Patient is agreeable. 3 month lab recheck scheduled for 06/30/2018 at 11 am. Patient is aware she will need to be fasting for lab work. Encounter closed.

## 2018-05-02 ENCOUNTER — Encounter: Payer: Managed Care, Other (non HMO) | Admitting: Internal Medicine

## 2018-05-18 ENCOUNTER — Encounter: Payer: Self-pay | Admitting: Internal Medicine

## 2018-05-18 ENCOUNTER — Ambulatory Visit (INDEPENDENT_AMBULATORY_CARE_PROVIDER_SITE_OTHER): Payer: BLUE CROSS/BLUE SHIELD | Admitting: Internal Medicine

## 2018-05-18 VITALS — BP 118/80 | HR 62 | Temp 98.4°F | Ht 66.0 in | Wt 336.0 lb

## 2018-05-18 DIAGNOSIS — Z0000 Encounter for general adult medical examination without abnormal findings: Secondary | ICD-10-CM

## 2018-05-18 DIAGNOSIS — E559 Vitamin D deficiency, unspecified: Secondary | ICD-10-CM | POA: Diagnosis not present

## 2018-05-18 MED ORDER — TRIAMTERENE-HCTZ 37.5-25 MG PO TABS: 1.0000 | ORAL_TABLET | Freq: Every day | ORAL | 0 refills | Status: DC | PRN

## 2018-05-18 MED ORDER — VITAMIN D (ERGOCALCIFEROL) 1.25 MG (50000 UNIT) PO CAPS: 50000.0000 [IU] | ORAL_CAPSULE | ORAL | 3 refills | Status: DC

## 2018-05-18 NOTE — Assessment & Plan Note (Signed)
Re-start Vit D Risks associated with treatment noncompliance were discussed. Compliance was encouraged.  

## 2018-05-18 NOTE — Assessment & Plan Note (Signed)
Re-start Vit D, Vit B12 Risks associated with treatment noncompliance were discussed. Compliance was encouraged.

## 2018-05-18 NOTE — Assessment & Plan Note (Signed)
We discussed age appropriate health related issues, including available/recomended screening tests and vaccinations. We discussed a need for adhering to healthy diet and exercise. Labs were ordered to be later reviewed . All questions were answered. CT coronary calcium score test was offered

## 2018-05-18 NOTE — Progress Notes (Signed)
Subjective:  Patient ID: Brianna Bean, female    DOB: 02-Dec-1960  Age: 58 y.o. MRN: [id number]  CC: No chief complaint on file.   HPI Brianna Bean presents for a well exam C/o L foot pain  Outpatient Medications Prior to Visit  Medication Sig Dispense Refill  . Cholecalciferol (VITAMIN D3) 2000 units capsule Take 1 capsule (2,000 Units total) by mouth daily. 100 capsule 3  . Cyanocobalamin (VITAMIN B-12) 1000 MCG SUBL Place 1 tablet (1,000 mcg total) under the tongue daily. 100 tablet 3  . NEXIUM 40 MG capsule Take 1 capsule (40 mg total) by mouth daily before breakfast. Overdue for yearly physical must see MD for refills (Patient taking differently: Take 40 mg by mouth daily before breakfast. ) 30 capsule 0  . norethindrone (HEATHER) 0.35 MG tablet Take 1 tablet (0.35 mg total) by mouth daily. 3 Package 4  . Vitamin D, Ergocalciferol, (DRISDOL) 1.25 MG (50000 UT) CAPS capsule Take 1 capsule (50,000 Units total) by mouth every 7 (seven) days. 12 capsule 0   No facility-administered medications prior to visit.     ROS: Review of Systems  Constitutional: Negative for activity change, appetite change, chills, fatigue and unexpected weight change.  HENT: Negative for congestion, mouth sores and sinus pressure.   Eyes: Negative for visual disturbance.  Respiratory: Negative for cough and chest tightness.   Gastrointestinal: Negative for abdominal pain and nausea.  Genitourinary: Negative for difficulty urinating, frequency and vaginal pain.  Musculoskeletal: Negative for back pain and gait problem.  Skin: Negative for pallor and rash.  Neurological: Negative for dizziness, tremors, weakness, numbness and headaches.  Psychiatric/Behavioral: Negative for confusion, sleep disturbance and suicidal ideas. The patient is not nervous/anxious.     Objective:  BP 118/80 (BP Location: Left Arm, Patient Position: Sitting, Cuff Size: Large)   Pulse 62   Temp 98.4 F (36.9 C) (Oral)    Ht 5\' 6"  (1.676 m)   Wt (!) 336 lb (152.4 kg)   LMP 05/13/2013 (Approximate)   SpO2 96%   BMI 54.23 kg/m   BP Readings from Last 3 Encounters:  05/18/18 118/80  03/27/18 120/80  02/11/17 120/70    Wt Readings from Last 3 Encounters:  05/18/18 (!) 336 lb (152.4 kg)  03/27/18 (!) 332 lb (150.6 kg)  02/11/17 298 lb (135.2 kg)    Physical Exam Constitutional:      General: She is not in acute distress.    Appearance: She is well-developed.  HENT:     Head: Normocephalic.     Right Ear: External ear normal.     Left Ear: External ear normal.     Nose: Nose normal.  Eyes:     General:        Right eye: No discharge.        Left eye: No discharge.     Conjunctiva/sclera: Conjunctivae normal.     Pupils: Pupils are equal, round, and reactive to light.  Neck:     Musculoskeletal: Normal range of motion and neck supple.     Thyroid: No thyromegaly.     Vascular: No JVD.     Trachea: No tracheal deviation.  Cardiovascular:     Rate and Rhythm: Normal rate and regular rhythm.     Heart sounds: Normal heart sounds.  Pulmonary:     Effort: No respiratory distress.     Breath sounds: No stridor. No wheezing.  Abdominal:     General: Bowel sounds are normal. There  is no distension.     Palpations: Abdomen is soft. There is no mass.     Tenderness: There is no abdominal tenderness. There is no guarding or rebound.  Musculoskeletal:        General: No tenderness.  Lymphadenopathy:     Cervical: No cervical adenopathy.  Skin:    Findings: No erythema or rash.  Neurological:     Cranial Nerves: No cranial nerve deficit.     Motor: No abnormal muscle tone.     Coordination: Coordination normal.     Deep Tendon Reflexes: Reflexes normal.  Psychiatric:        Behavior: Behavior normal.        Thought Content: Thought content normal.        Judgment: Judgment normal.   Obese  Lab Results  Component Value Date   WBC 4.7 04/25/2018   HGB 12.2 04/25/2018   HCT 38.7  04/25/2018   PLT 246 04/25/2018   GLUCOSE 96 04/25/2018   CHOL 185 04/25/2018   TRIG 46 04/25/2018   HDL 60 04/25/2018   LDLCALC 116 (H) 04/25/2018   ALT 20 04/25/2018   AST 16 04/25/2018   NA 143 04/25/2018   K 4.6 04/25/2018   CL 105 04/25/2018   CREATININE 0.72 04/25/2018   BUN 11 04/25/2018   CO2 24 04/25/2018   TSH 3.090 04/25/2018   HGBA1C 5.6 08/12/2014    No results found.  Assessment & Plan:   There are no diagnoses linked to this encounter.   No orders of the defined types were placed in this encounter.    Follow-up: No follow-ups on file.  Walker Kehr, MD

## 2018-05-18 NOTE — Patient Instructions (Signed)
Rice sock for neck Plantar Fasciitis  Plantar fasciitis is a painful foot condition that affects the heel. It occurs when the band of tissue that connects the toes to the heel bone (plantar fascia) becomes irritated. This can happen as the result of exercising too much or doing other repetitive activities (overuse injury). The pain from plantar fasciitis can range from mild irritation to severe pain that makes it difficult to walk or move. The pain is usually worse in the morning after sleeping, or after sitting or lying down for a while. Pain may also be worse after long periods of walking or standing. What are the causes? This condition may be caused by:  Standing for long periods of time.  Wearing shoes that do not have good arch support.  Doing activities that put stress on joints (high-impact activities), including running, aerobics, and ballet.  Being overweight.  An abnormal way of walking (gait).  Tight muscles in the back of your lower leg (calf).  High arches in your feet.  Starting a new athletic activity. What are the signs or symptoms? The main symptom of this condition is heel pain. Pain may:  Be worse with first steps after a time of rest, especially in the morning after sleeping or after you have been sitting or lying down for a while.  Be worse after long periods of standing still.  Decrease after 30-45 minutes of activity, such as gentle walking. How is this diagnosed? This condition may be diagnosed based on your medical history and your symptoms. Your health care provider may ask questions about your activity level. Your health care provider will do a physical exam to check for:  A tender area on the bottom of your foot.  A high arch in your foot.  Pain when you move your foot.  Difficulty moving your foot. You may have imaging tests to confirm the diagnosis, such as:  X-rays.  Ultrasound.  MRI. How is this treated? Treatment for plantar fasciitis  depends on how severe your condition is. Treatment may include:  Rest, ice, applying pressure (compression), and raising the affected foot (elevation). This may be called RICE therapy. Your health care provider may recommend RICE therapy along with over-the-counter pain medicines to manage your pain.  Exercises to stretch your calves and your plantar fascia.  A splint that holds your foot in a stretched, upward position while you sleep (night splint).  Physical therapy to relieve symptoms and prevent problems in the future.  Injections of steroid medicine (cortisone) to relieve pain and inflammation.  Stimulating your plantar fascia with electrical impulses (extracorporeal shock wave therapy). This is usually the last treatment option before surgery.  Surgery, if other treatments have not worked after 12 months. Follow these instructions at home:  Managing pain, stiffness, and swelling  If directed, put ice on the painful area: ? Put ice in a plastic bag, or use a frozen bottle of water. ? Place a towel between your skin and the bag or bottle. ? Roll the bottom of your foot over the bag or bottle. ? Do this for 20 minutes, 2-3 times a day.  Wear athletic shoes that have air-sole or gel-sole cushions, or try wearing soft shoe inserts that are designed for plantar fasciitis.  Raise (elevate) your foot above the level of your heart while you are sitting or lying down. Activity  Avoid activities that cause pain. Ask your health care provider what activities are safe for you.  Do physical therapy exercises  and stretches as told by your health care provider.  Try activities and forms of exercise that are easier on your joints (low-impact). Examples include swimming, water aerobics, and biking. General instructions  Take over-the-counter and prescription medicines only as told by your health care provider.  Wear a night splint while sleeping, if told by your health care provider.  Loosen the splint if your toes tingle, become numb, or turn cold and blue.  Maintain a healthy weight, or work with your health care provider to lose weight as needed.  Keep all follow-up visits as told by your health care provider. This is important. Contact a health care provider if you:  Have symptoms that do not go away after caring for yourself at home.  Have pain that gets worse.  Have pain that affects your ability to move or do your daily activities. Summary  Plantar fasciitis is a painful foot condition that affects the heel. It occurs when the band of tissue that connects the toes to the heel bone (plantar fascia) becomes irritated.  The main symptom of this condition is heel pain that may be worse after exercising too much or standing still for a long time.  Treatment varies, but it usually starts with rest, ice, compression, and elevation (RICE therapy) and over-the-counter medicines to manage pain. This information is not intended to replace advice given to you by your health care provider. Make sure you discuss any questions you have with your health care provider. Document Released: 12/22/2000 Document Revised: 01/24/2017 Document Reviewed: 01/24/2017 Elsevier Interactive Patient Education  2019 [employer].

## 2018-05-19 LAB — COLOGUARD: COLOGUARD: NEGATIVE

## 2018-06-06 DIAGNOSIS — Z1212 Encounter for screening for malignant neoplasm of rectum: Secondary | ICD-10-CM | POA: Diagnosis not present

## 2018-06-06 DIAGNOSIS — Z1211 Encounter for screening for malignant neoplasm of colon: Secondary | ICD-10-CM | POA: Diagnosis not present

## 2018-06-19 ENCOUNTER — Encounter: Payer: Self-pay | Admitting: Internal Medicine

## 2018-06-30 ENCOUNTER — Other Ambulatory Visit: Payer: BLUE CROSS/BLUE SHIELD

## 2018-08-10 ENCOUNTER — Other Ambulatory Visit: Payer: BLUE CROSS/BLUE SHIELD

## 2018-09-19 ENCOUNTER — Other Ambulatory Visit: Payer: Self-pay

## 2018-09-21 ENCOUNTER — Other Ambulatory Visit: Payer: Self-pay

## 2018-09-21 ENCOUNTER — Other Ambulatory Visit: Payer: Self-pay | Admitting: *Deleted

## 2018-09-21 ENCOUNTER — Other Ambulatory Visit (INDEPENDENT_AMBULATORY_CARE_PROVIDER_SITE_OTHER): Payer: BC Managed Care – PPO

## 2018-09-21 DIAGNOSIS — E559 Vitamin D deficiency, unspecified: Secondary | ICD-10-CM | POA: Diagnosis not present

## 2018-09-21 DIAGNOSIS — E221 Hyperprolactinemia: Secondary | ICD-10-CM

## 2018-09-22 LAB — VITAMIN D 25 HYDROXY (VIT D DEFICIENCY, FRACTURES): Vit D, 25-Hydroxy: 30.8 ng/mL (ref 30.0–100.0)

## 2018-09-22 LAB — PROLACTIN: Prolactin: 35.5 ng/mL — ABNORMAL HIGH (ref 4.8–23.3)

## 2018-09-25 MED ORDER — VITAMIN D (ERGOCALCIFEROL) 1.25 MG (50000 UNIT) PO CAPS: 50000.0000 [IU] | ORAL_CAPSULE | ORAL | 1 refills | Status: DC

## 2018-11-11 IMAGING — RF DG ESOPHAGUS
2 series · 15 of 15 positions shown · non-contrast
Comparison: None.

CLINICAL DATA: Esophageal dysfunction. Laparoscopic gastric banding
3777

EXAM:
ESOPHOGRAM/BARIUM SWALLOW
TECHNIQUE: Single contrast examination was performed using  thin barium.
FLUOROSCOPY TIME:  Fluoroscopy Time:  1 minutes 18 seconds
Radiation Exposure Index (if provided by the fluoroscopic device):
Number of Acquired Spot Images: 0

[Series 1: sequence · 4 of 33 frames shown]
[frame 5/33]
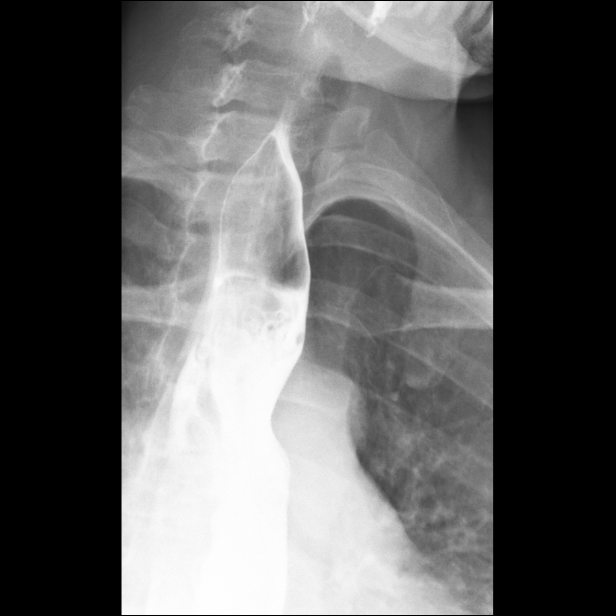
[frame 17/33]
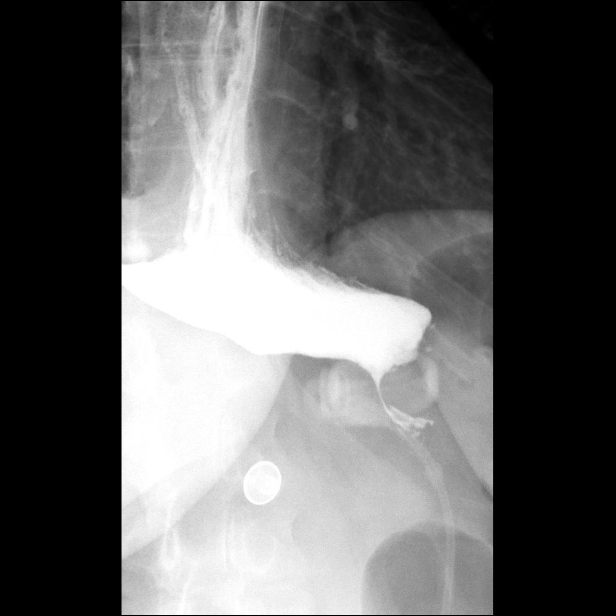
[frame 23/33]
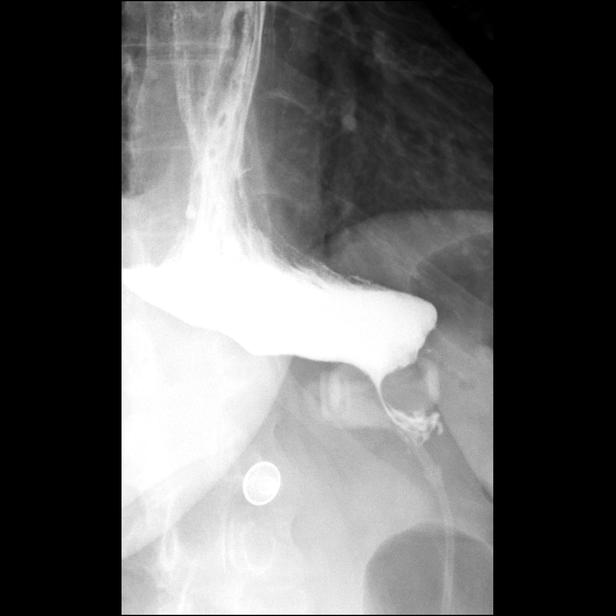
[frame 29/33]
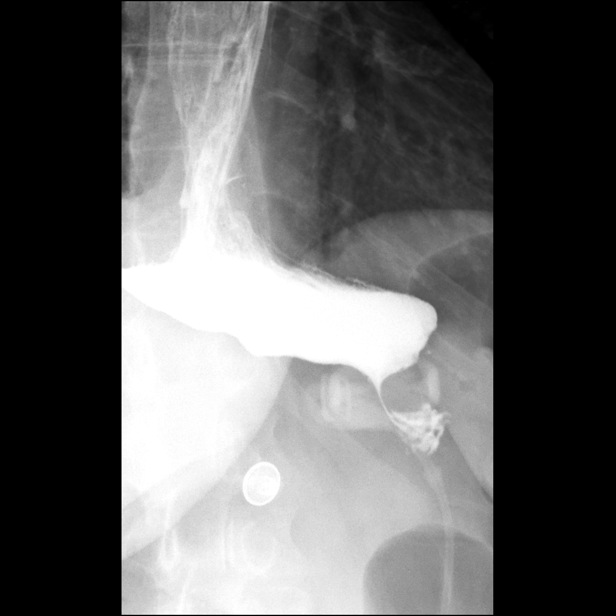

[Series 2: one shot · 11 of 11 slices shown]
[im 1/11]
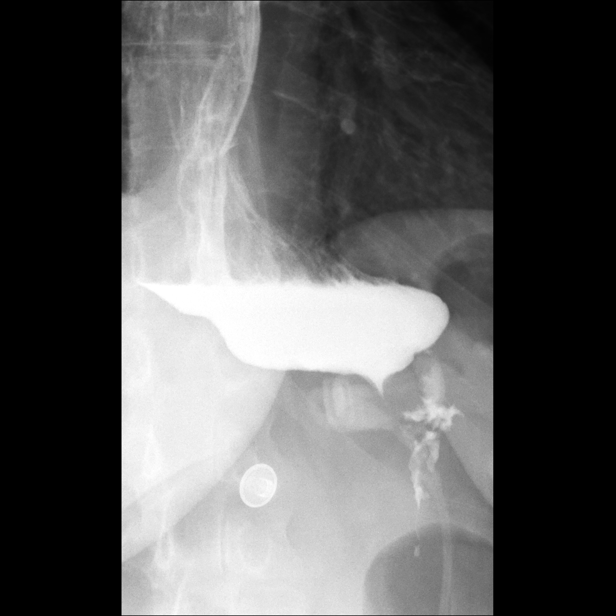
[im 2/11]
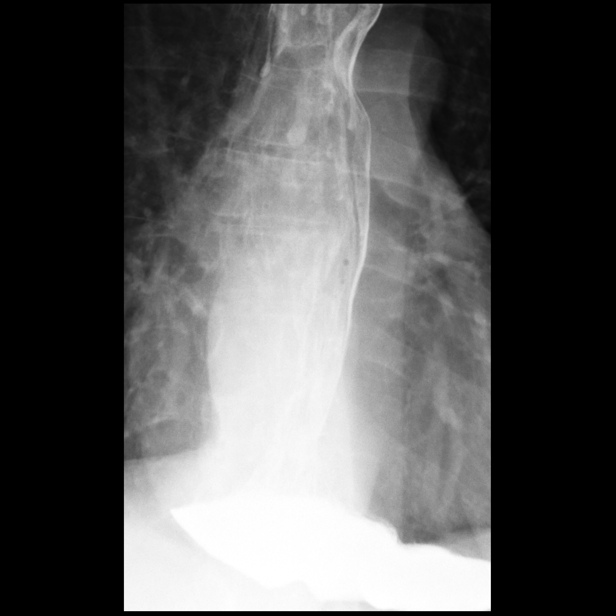
[im 3/11]
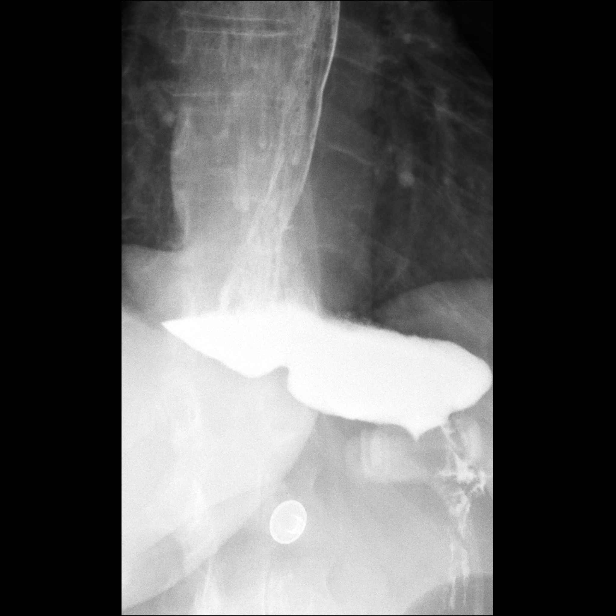
[im 4/11]
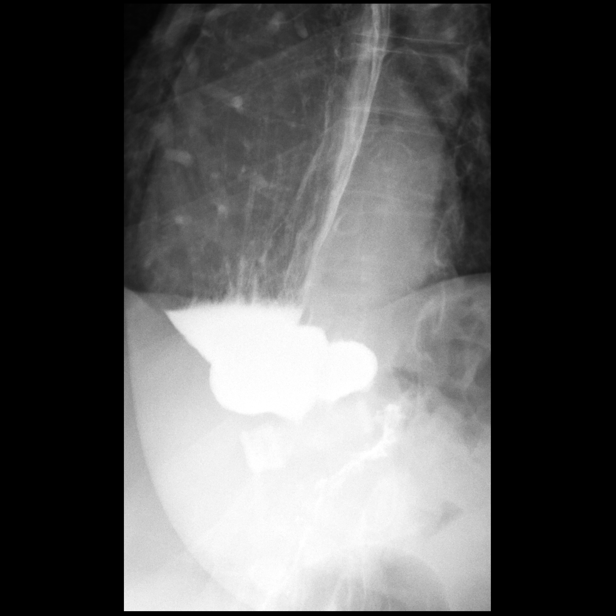
[im 5/11]
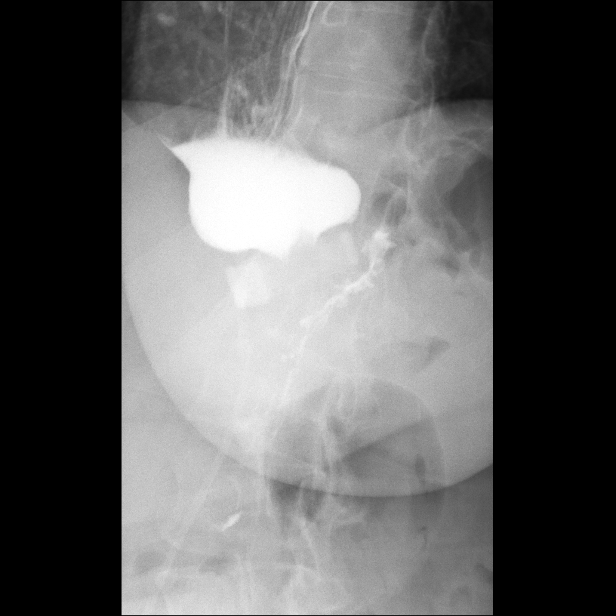
[im 6/11]
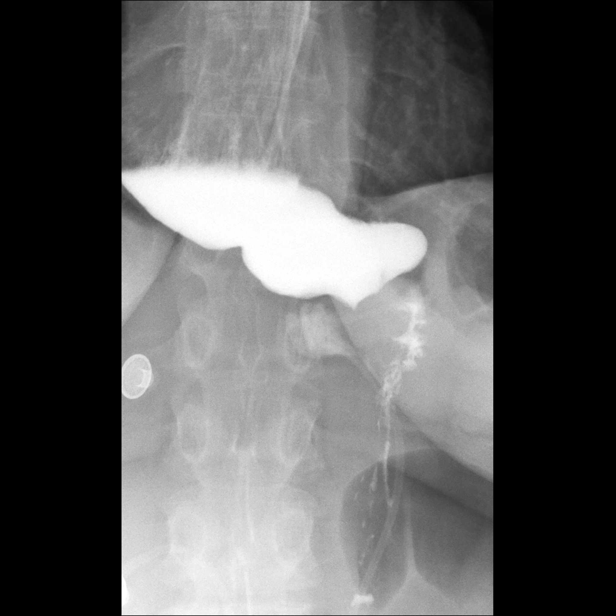
[im 7/11]
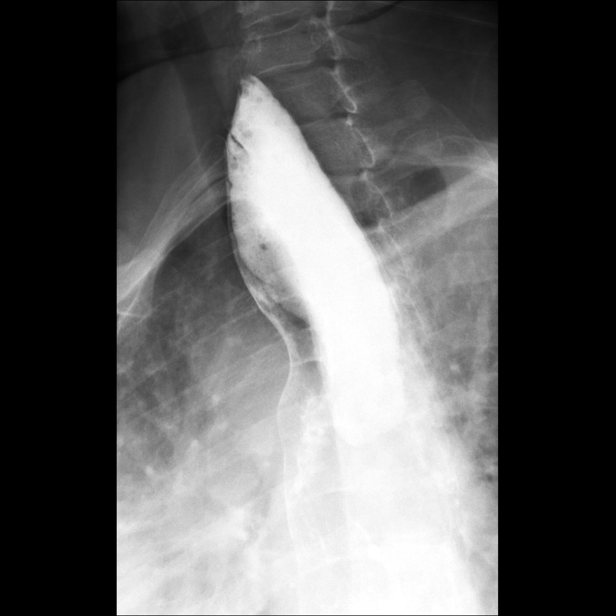
[im 8/11]
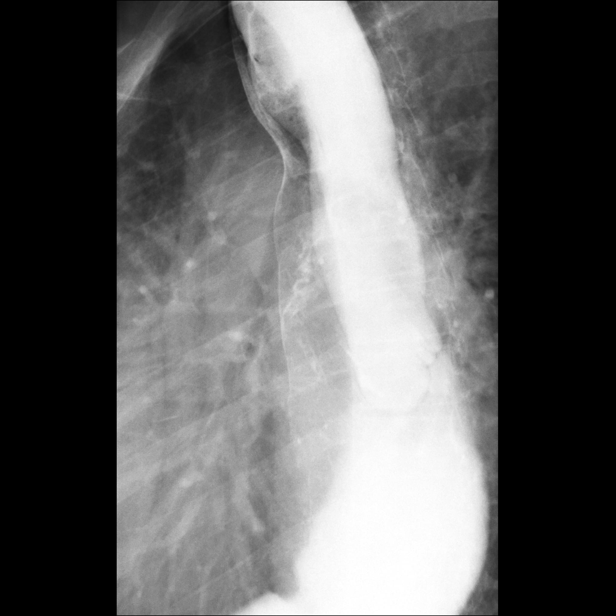
[im 9/11]
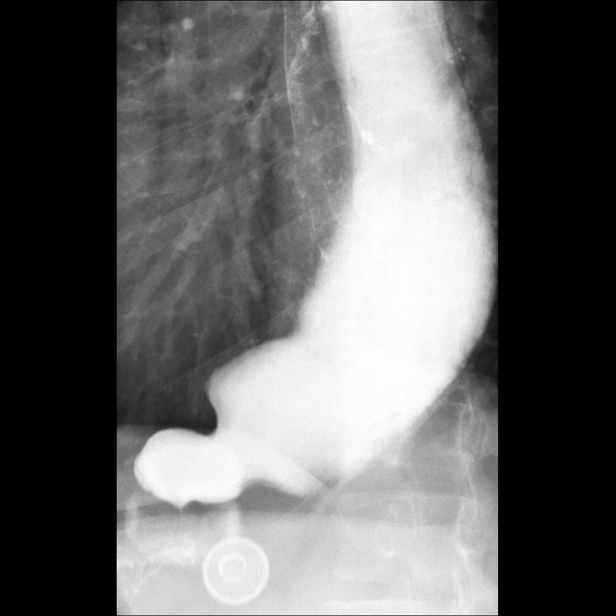
[im 10/11]
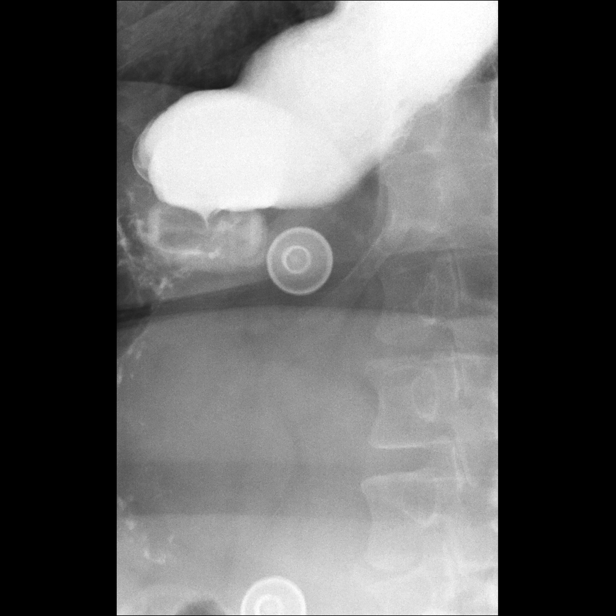
[im 11/11]
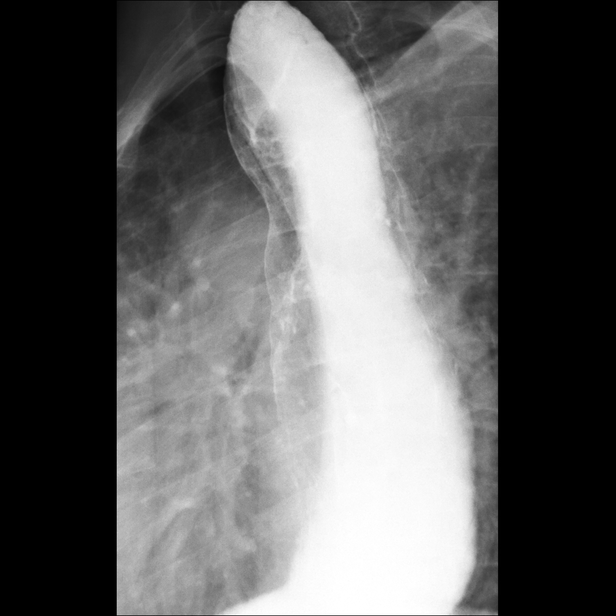

[15 of 15 positions shown; findings below may reference images not displayed]

FINDINGS: Marked dilatation of the esophagus down to the level of the lap
band. Esophagus is flaccid. Smooth mucosal surface without mass or
ulceration.

The laparoscopic band is in satisfactory position. The band is very
tight with only a trickle of barium passing through into the
stomach.
IMPRESSION: Near complete obstruction esophagus due to tight laparoscopic band.
Marked dilatation of the esophagus

These results will be called to the ordering clinician or
representative by the Radiologist Assistant, and communication
documented in the PACS or zVision Dashboard.

## 2019-01-06 IMAGING — RF DG ESOPHAGUS
7 of 8 series · 14 of 24 positions shown · non-contrast
Comparison: 11/05/2016

CLINICAL DATA: Status post lap band placement. Saline has been
removed from the lap band in the interval since prior study.

EXAM:
ESOPHOGRAM/BARIUM SWALLOW
TECHNIQUE: Single contrast examination was performed using  thin barium.
FLUOROSCOPY TIME:  Fluoroscopy Time:  2 minutes and 12 seconds
Radiation Exposure Index (if provided by the fluoroscopic device):
690 mGy
Number of Acquired Spot Images: 0

[Series 1: sequence · 2 of 16 frames shown (1 of 7)]
[frame 3/16]
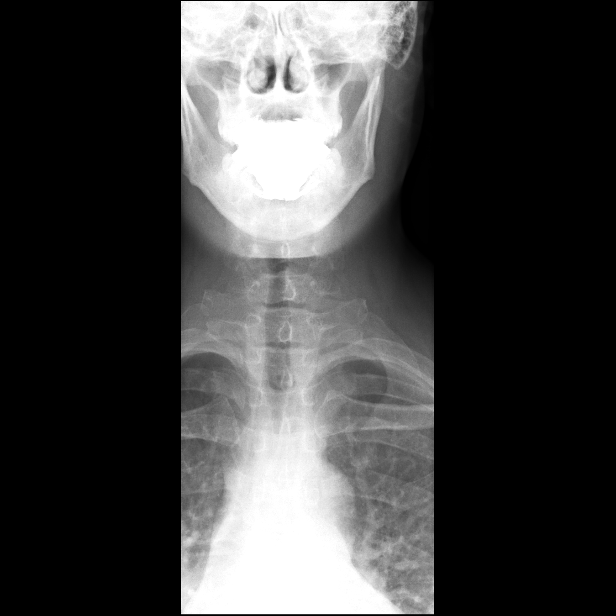
[frame 10/16]
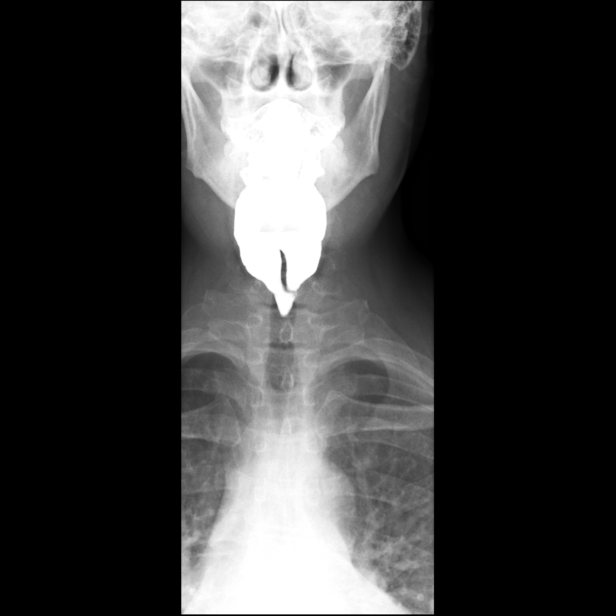

[Series 2: sequence · 2 of 15 frames shown (2 of 7)]
[frame 8/15]
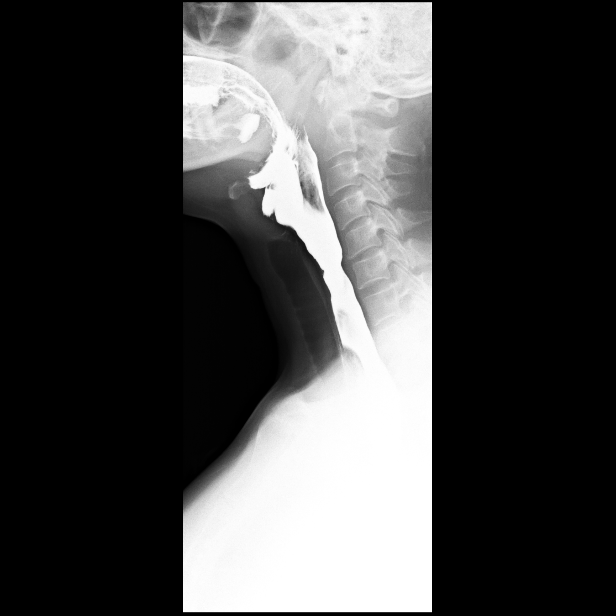
[frame 13/15]
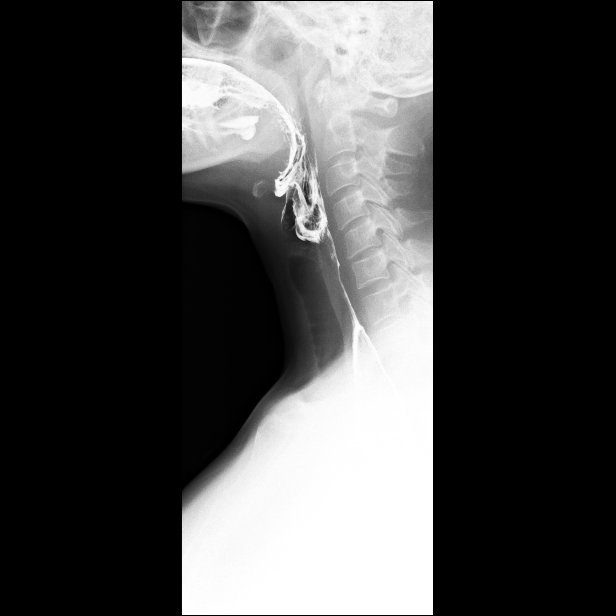

[Series 3: sequence · 1 of 2 frames shown (3 of 7)]
[frame 1/2]
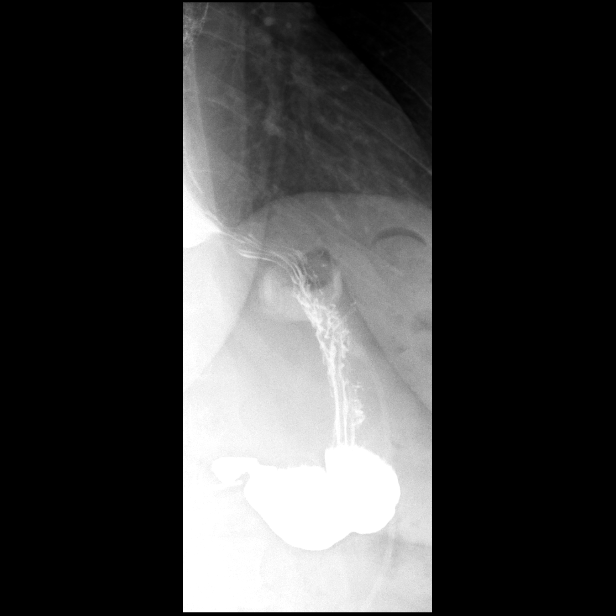

[Series 5: sequence · 3 of 109 frames shown (4 of 7)]
[frame 17/109]
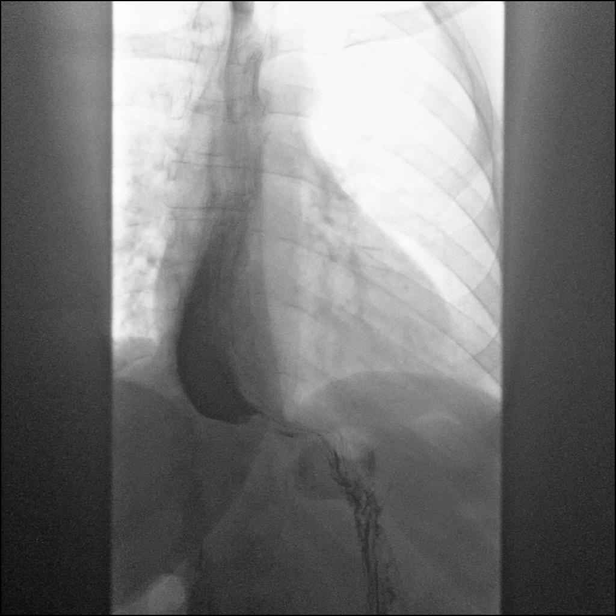
[frame 93/109]
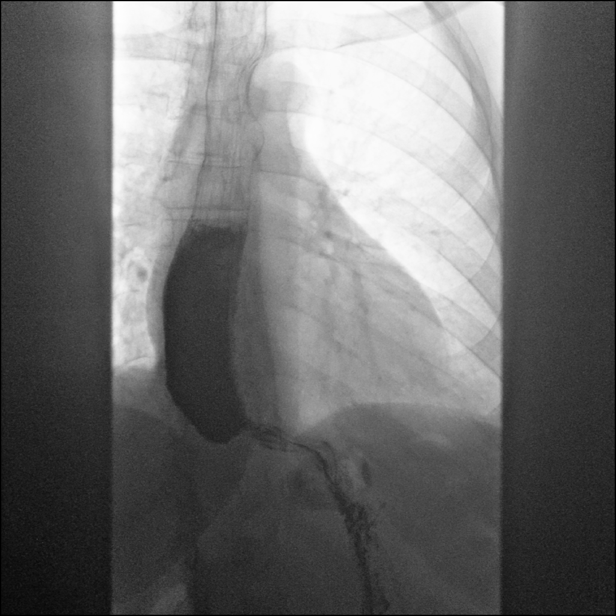
[frame 104/109]
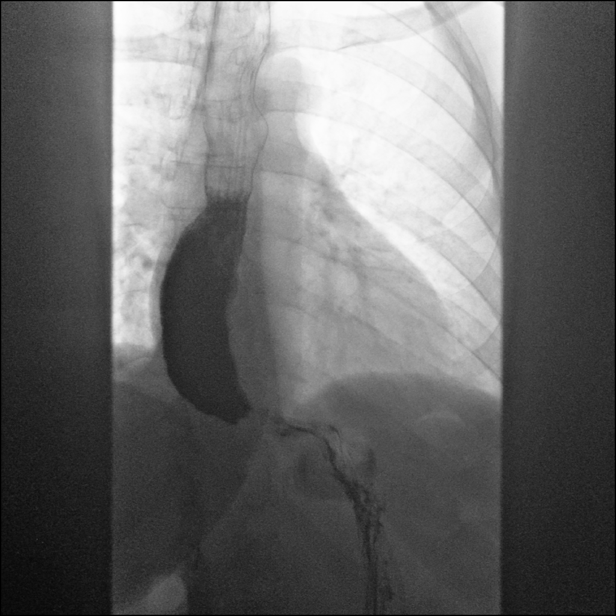

[Series 7: sequence · 2 of 115 frames shown (5 of 7)]
[frame 2/115]
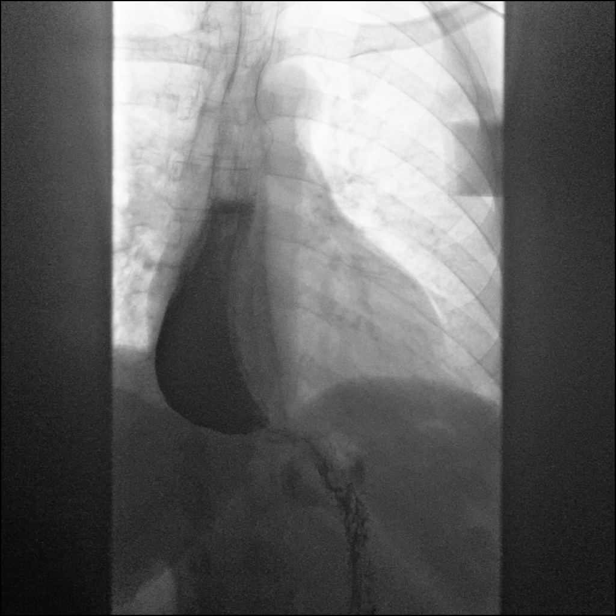
[frame 98/115]
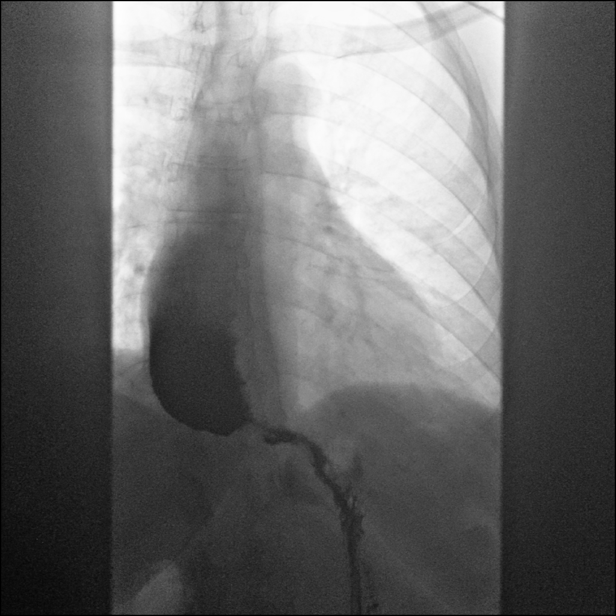

[Series 8: sequence · 2 of 54 frames shown (6 of 7)]
[frame 28/54]
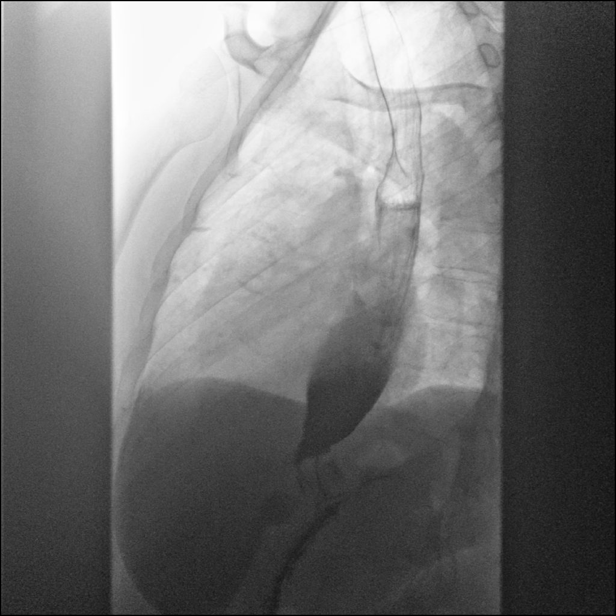
[frame 46/54]
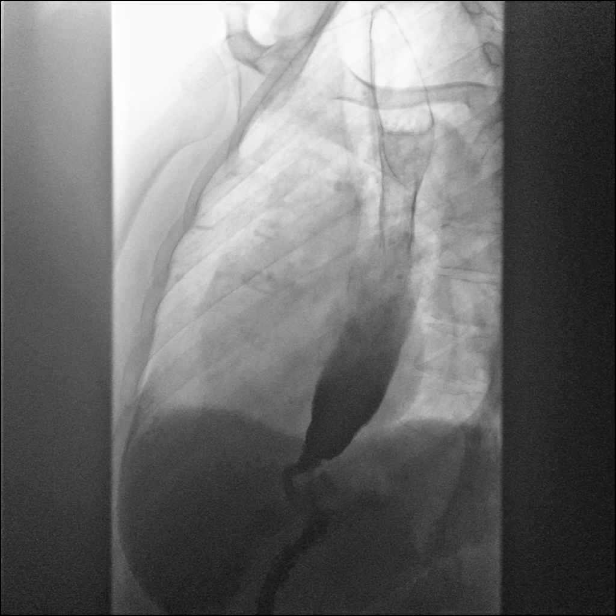

[Series 9: sequence · 2 of 82 frames shown (7 of 7)]
[frame 42/82]
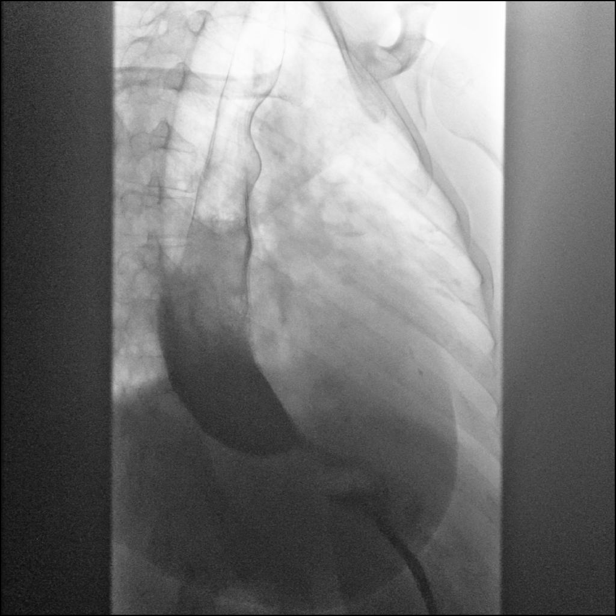
[frame 71/82]
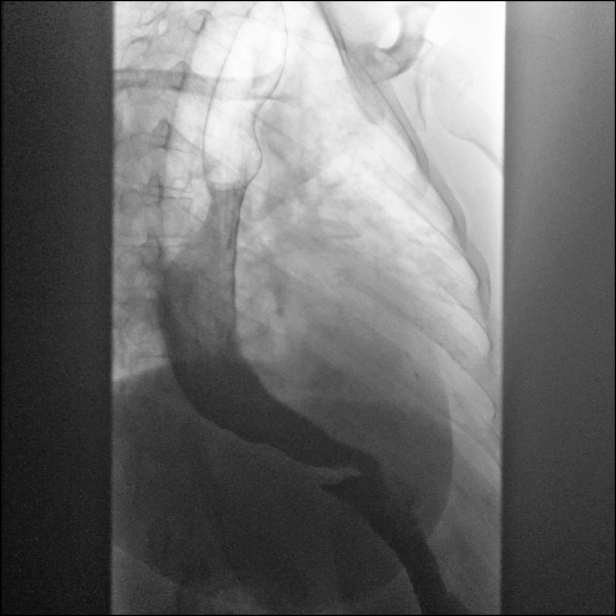

[14 of 24 positions shown; findings below may reference images not displayed]

FINDINGS: Frontal and lateral views of the hypopharynx while swallowing are
normal.

Lap band remains a similar orientation to prior study, from
approximately [DATE] to [DATE] although slightly oblique compared to the
prior exam.

While esophagus is patulous on today's study, it is less though then
on the previous exam. No appreciable primary peristalsis observed
with primarily gravity flow through the lap band into the distal
stomach. Given the lack of motility in the esophagus, there is some
stasis of barium in the distal esophagus before passes through the
lap band.

As barium passes through the lap band, the luminal diameter of the
proximal stomach has the expected diameter.
IMPRESSION: 1. Expected appearance of the proximal stomach at the level of the
lap band. Flow through the lap band appears appropriate given the
lack of esophageal peristalsis.
2. Esophagus remains patulous, but less so than seen on the prior
study. Also, as on the prior study, there is a lack of primary
peristalsis with mainly gravity-fed flow of thin barium through the
lap band.

## 2019-01-30 DIAGNOSIS — Z1231 Encounter for screening mammogram for malignant neoplasm of breast: Secondary | ICD-10-CM | POA: Diagnosis not present

## 2019-03-10 ENCOUNTER — Other Ambulatory Visit: Payer: Self-pay | Admitting: Obstetrics and Gynecology

## 2019-03-12 NOTE — Telephone Encounter (Signed)
Medication refill request: Vitamin D  Last AEX: 03/27/18  Next AEX: 05/06/18 Last MMG (if hormonal medication request): 01/30/19 Bi Rads 1 Neg Refill authorized: #3 with 0 RF to get her to her AEX

## 2019-03-29 ENCOUNTER — Ambulatory Visit: Payer: Managed Care, Other (non HMO) | Admitting: Obstetrics and Gynecology

## 2019-05-03 ENCOUNTER — Other Ambulatory Visit: Payer: Self-pay

## 2019-05-03 NOTE — Progress Notes (Signed)
59 y.o. G0P0000 Single African American female here for annual exam.    Patient's last prolactin was 35.5 and her vit D was 30.8 on 09/21/18.  Her estradiol was 23.9 and FSH was 34 on 04/25/18.   No vaginal bleeding.  Thinks she may be having some hot flashes, which is not new.   She has some bloating last week, and it has improved.   Having hair loss for a while. Normal TSH last year.   Working from home during the pandemic.  PCP: Bella Kennedy, MD    Patient's last menstrual period was 05/13/2013 (approximate).           Sexually active: No.  The current method of family planning is post menopausal status.    Exercising: Yes.    some stretches Smoker:  no  Health Maintenance: Pap:02-11-17 Neg:Neg HR HPV, 01-10-14 Neg, 01-10-10 Normal  History of abnormal Pap:  no MMG: 01-30-19 3D/Neg/density B/BiRads1 Colonoscopy: 05-19-18 cologuard Neg, colonoscopy 05/2012 normal;next 10 years BMD:   n/a  Result  n/a TDaP:  11-21-15 Gardasil:   no HIV:no Hep C: 11-21-15 Neg Screening Labs:  PCP.  Flu vaccine: recommended.    reports that she has never smoked. She has never used smokeless tobacco. She reports current alcohol use of about 3.0 standard drinks of alcohol per week. She reports that she does not use drugs.  Past Medical History:  Diagnosis Date  . ALLERGIC RHINITIS 02/15/2008  . Fibroids 06/21/13   [facility] - 2 fibroids less than 1 cm  . Gallstones   . GERD 02/15/2008  . 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 Low vitamin D level 2020  . OBESITY, MORBID 02/15/2008    Past Surgical History:  Procedure Laterality Date  . LAPAROSCOPIC GASTRIC BANDING  2006   --Dr. Excell Seltzer    Current Outpatient Medications  Medication Sig Dispense Refill  . Cyanocobalamin (VITAMIN B-12) 1000 MCG SUBL Place 1 tablet (1,000 mcg total) under the tongue daily. 100 tablet 3  . norethindrone (HEATHER) 0.35 MG tablet Take 1 tablet (0.35 mg total) by mouth daily. 3 Package 4  .  triamterene-hydrochlorothiazide (MAXZIDE-25) 37.5-25 MG tablet Take 1 tablet by mouth daily as needed. For swelling 90 tablet 0  . Vitamin D, Ergocalciferol, (DRISDOL) 1.25 MG (50000 UT) CAPS capsule TAKE 1 CAPSULE EVERY 30 DAYS 3 capsule 0   No current facility-administered medications for this visit.    Family History  Problem Relation Age of Onset  . Other Father        Dec age 44 MVA  . Osteoarthritis Mother   . Hypertension Mother   . Hyperlipidemia Mother   . Diabetes Other        1st degree relative  . Stroke Paternal Grandmother     Review of Systems  All other systems reviewed and are negative.   Exam:   BP 132/84 (Cuff Size: Large)   Pulse 70   Temp (!) 96 F (35.6 C) (Temporal)   Resp 16   Ht 5\' 5"  (1.651 m)   Wt (!) 321 lb (145.6 kg)   LMP 05/13/2013 (Approximate)   BMI 53.42 kg/m     General appearance: alert, cooperative and appears stated age Head: normocephalic, without obvious abnormality, atraumatic Neck: no adenopathy, supple, symmetrical, trachea midline and thyroid normal to inspection and palpation Lungs: clear to auscultation bilaterally Breasts: normal appearance, no masses or tenderness, No nipple retraction or dimpling, No nipple discharge or bleeding, No axillary adenopathy Heart: regular rate and rhythm  Abdomen: soft, non-tender; no masses, no organomegaly Extremities: extremities normal, atraumatic, no cyanosis or edema Skin: skin color, texture, turgor normal. No rashes or lesions Lymph nodes: cervical, supraclavicular, and axillary nodes normal. Neurologic: grossly normal  Pelvic: External genitalia:  no lesions              No abnormal inguinal nodes palpated.              Urethra:  normal appearing urethra with no masses, tenderness or lesions              Bartholins and Skenes: normal                 Vagina: normal appearing vagina with normal color and discharge, no lesions              Cervix: no lesions              Pap taken:  No. Bimanual Exam:  Uterus:  normal size, contour, position, consistency, mobility, non-tender              Adnexa: no mass, fullness, tenderness              Rectal exam: Yes.  .  Confirms.              Anus:  normal sphincter tone, no lesions  Chaperone was present for exam.  Assessment:   Well woman visit with normal exam. Amenorrhea. Thin endometrium on pelvic ultrasound in 2016.On Micronor. Obesity.Hx lap band. Hx elevated prolactin. She has had negative imaging of brain.  Plan: Mammogram screening discussed. Self breast awareness reviewed. Pap and HR HPV as above. Guidelines for Calcium, Vitamin D, regular exercise program including cardiovascular and weight bearing exercise. She will return for an [place], estradiol, prolactin, and vit D.  She will be off her Micronor for 2 weeks prior to doing her blood work.  We discussed weight loss and returning to the care of a weight loss surgeon.  She may pursue this in [place]. Follow up annually and prn.  After visit summary provided.

## 2019-05-07 ENCOUNTER — Ambulatory Visit (INDEPENDENT_AMBULATORY_CARE_PROVIDER_SITE_OTHER): Payer: BC Managed Care – PPO | Admitting: Obstetrics and Gynecology

## 2019-05-07 ENCOUNTER — Other Ambulatory Visit: Payer: Self-pay

## 2019-05-07 ENCOUNTER — Encounter: Payer: Self-pay | Admitting: Obstetrics and Gynecology

## 2019-05-07 VITALS — BP 132/84 | HR 70 | Temp 96.0°F | Resp 16 | Ht 65.0 in | Wt 321.0 lb

## 2019-05-07 DIAGNOSIS — N951 Menopausal and female climacteric states: Secondary | ICD-10-CM

## 2019-05-07 DIAGNOSIS — R7989 Other specified abnormal findings of blood chemistry: Secondary | ICD-10-CM | POA: Diagnosis not present

## 2019-05-07 DIAGNOSIS — Z01419 Encounter for gynecological examination (general) (routine) without abnormal findings: Secondary | ICD-10-CM

## 2019-05-16 ENCOUNTER — Other Ambulatory Visit: Payer: Self-pay | Admitting: Obstetrics and Gynecology

## 2019-05-16 NOTE — Telephone Encounter (Signed)
Medication refill request: heather Last AEX:  05-07-2019 Next AEX: 05-07-2020 Last MMG (if hormonal medication request): 01/2019 neg  Refill authorized: Per aex note, patient is to be off of pills for 2 weeks & come back for bloodwork to check [facility] & other labs. [facility] appt is 3-10-202. Please approve if appropriate

## 2019-05-21 ENCOUNTER — Encounter: Payer: Self-pay | Admitting: Internal Medicine

## 2019-05-21 ENCOUNTER — Ambulatory Visit (INDEPENDENT_AMBULATORY_CARE_PROVIDER_SITE_OTHER): Payer: BC Managed Care – PPO | Admitting: Internal Medicine

## 2019-05-21 ENCOUNTER — Other Ambulatory Visit: Payer: Self-pay

## 2019-05-21 VITALS — BP 134/86 | HR 67 | Temp 98.3°F | Ht 65.0 in | Wt 318.0 lb

## 2019-05-21 DIAGNOSIS — Z0000 Encounter for general adult medical examination without abnormal findings: Secondary | ICD-10-CM

## 2019-05-21 DIAGNOSIS — K219 Gastro-esophageal reflux disease without esophagitis: Secondary | ICD-10-CM

## 2019-05-21 DIAGNOSIS — E559 Vitamin D deficiency, unspecified: Secondary | ICD-10-CM

## 2019-05-21 LAB — BASIC METABOLIC PANEL
BUN: 10 mg/dL (ref 6–23)
CO2: 26 mEq/L (ref 19–32)
Calcium: 9.6 mg/dL (ref 8.4–10.5)
Chloride: 103 mEq/L (ref 96–112)
Creatinine, Ser: 0.74 mg/dL (ref 0.40–1.20)
GFR: 97.91 mL/min (ref >60.00)
Glucose, Bld: 92 mg/dL (ref 70–99)
Potassium: 3.7 mEq/L (ref 3.5–5.1)
Sodium: 136 mEq/L (ref 135–145)

## 2019-05-21 LAB — T4, FREE: Free T4: 0.8 ng/dL (ref 0.60–1.60)

## 2019-05-21 LAB — LIPID PANEL
Cholesterol: 189 mg/dL (ref 0–200)
HDL: 48.3 mg/dL (ref >39.00)
LDL Cholesterol: 131 mg/dL — ABNORMAL HIGH (ref 0–99)
NonHDL: 141.16
Total CHOL/HDL Ratio: 4
Triglycerides: 49 mg/dL (ref 0.0–149.0)
VLDL: 9.8 mg/dL (ref 0.0–40.0)

## 2019-05-21 LAB — HEPATIC FUNCTION PANEL
ALT: 13 U/L (ref 0–35)
AST: 13 U/L (ref 0–37)
Albumin: 4 g/dL (ref 3.5–5.2)
Alkaline Phosphatase: 82 U/L (ref 39–117)
Bilirubin, Direct: 0.1 mg/dL (ref 0.0–0.3)
Total Bilirubin: 0.5 mg/dL (ref 0.2–1.2)
Total Protein: 7.7 g/dL (ref 6.0–8.3)

## 2019-05-21 LAB — CBC WITH DIFFERENTIAL/PLATELET
Basophils Absolute: 0 10*3/uL (ref 0.0–0.1)
Basophils Relative: 0.8 % (ref 0.0–3.0)
Eosinophils Absolute: 0 10*3/uL (ref 0.0–0.7)
Eosinophils Relative: 0.5 % (ref 0.0–5.0)
HCT: 40 % (ref 36.0–46.0)
Hemoglobin: 12.5 g/dL (ref 12.0–15.0)
Lymphocytes Relative: 31.7 % (ref 12.0–46.0)
Lymphs Abs: 1.6 10*3/uL (ref 0.7–4.0)
MCHC: 31.4 g/dL (ref 30.0–36.0)
MCV: 72.2 fl — ABNORMAL LOW (ref 78.0–100.0)
Monocytes Absolute: 0.4 10*3/uL (ref 0.1–1.0)
Monocytes Relative: 8.3 % (ref 3.0–12.0)
Neutro Abs: 3 10*3/uL (ref 1.4–7.7)
Neutrophils Relative %: 58.7 % (ref 43.0–77.0)
Platelets: 339 10*3/uL (ref 150.0–400.0)
RBC: 5.54 Mil/uL — ABNORMAL HIGH (ref 3.87–5.11)
RDW: 15.3 % (ref 11.5–15.5)
WBC: 5.2 10*3/uL (ref 4.0–10.5)

## 2019-05-21 LAB — VITAMIN D 25 HYDROXY (VIT D DEFICIENCY, FRACTURES): VITD: 29.83 ng/mL — ABNORMAL LOW (ref 30.00–100.00)

## 2019-05-21 LAB — VITAMIN B12: Vitamin B-12: >1500 pg/mL — ABNORMAL HIGH (ref 211–911)

## 2019-05-21 LAB — TSH: TSH: 1.83 u[IU]/mL (ref 0.35–4.50)

## 2019-05-21 MED ORDER — VITAMIN D (ERGOCALCIFEROL) 1.25 MG (50000 UNIT) PO CAPS: ORAL_CAPSULE | ORAL | 3 refills | Status: DC

## 2019-05-21 NOTE — Progress Notes (Signed)
Subjective:  Patient ID: Brianna Bean, female    DOB: 1960/12/24  Age: 59 y.o. MRN: [id number]  CC: No chief complaint on file.   HPI Brianna Bean presents for a well exam  Outpatient Medications Prior to Visit  Medication Sig Dispense Refill  . Cyanocobalamin (VITAMIN B-12) 1000 MCG SUBL Place 1 tablet (1,000 mcg total) under the tongue daily. 100 tablet 3  . HEATHER 0.35 MG tablet TAKE 1 TABLET DAILY 28 tablet 0  . Vitamin D, Ergocalciferol, (DRISDOL) 1.25 MG (50000 UT) CAPS capsule TAKE 1 CAPSULE EVERY 30 DAYS 3 capsule 0  . triamterene-hydrochlorothiazide (MAXZIDE-25) 37.5-25 MG tablet Take 1 tablet by mouth daily as needed. For swelling 90 tablet 0   No facility-administered medications prior to visit.    ROS: Review of Systems  Constitutional: Negative for activity change, appetite change, chills, fatigue and unexpected weight change.  HENT: Negative for congestion, mouth sores and sinus pressure.   Eyes: Negative for visual disturbance.  Respiratory: Negative for cough and chest tightness.   Gastrointestinal: Negative for abdominal pain and nausea.  Genitourinary: Negative for difficulty urinating, frequency and vaginal pain.  Musculoskeletal: Negative for back pain and gait problem.  Skin: Negative for pallor and rash.  Neurological: Negative for dizziness, tremors, weakness, numbness and headaches.  Psychiatric/Behavioral: Negative for confusion and sleep disturbance.    Objective:  BP 134/86   Pulse 67   Temp 98.3 F (36.8 C) (Oral)   Ht 5\' 5"  (1.651 m)   Wt (!) 318 lb (144.2 kg)   LMP 05/13/2013 (Approximate)   SpO2 98%   BMI 52.92 kg/m   BP Readings from Last 3 Encounters:  05/21/19 134/86  05/07/19 132/84  05/18/18 118/80    Wt Readings from Last 3 Encounters:  05/21/19 (!) 318 lb (144.2 kg)  05/07/19 (!) 321 lb (145.6 kg)  05/18/18 (!) 336 lb (152.4 kg)    Physical Exam Constitutional:      General: She is not in acute distress.  Appearance: She is well-developed. She is obese.  HENT:     Head: Normocephalic.     Right Ear: External ear normal.     Left Ear: External ear normal.     Nose: Nose normal.  Eyes:     General:        Right eye: No discharge.        Left eye: No discharge.     Conjunctiva/sclera: Conjunctivae normal.     Pupils: Pupils are equal, round, and reactive to light.  Neck:     Thyroid: No thyromegaly.     Vascular: No JVD.     Trachea: No tracheal deviation.  Cardiovascular:     Rate and Rhythm: Normal rate and regular rhythm.     Heart sounds: Normal heart sounds.  Pulmonary:     Effort: No respiratory distress.     Breath sounds: No stridor. No wheezing.  Abdominal:     General: Bowel sounds are normal. There is no distension.     Palpations: Abdomen is soft. There is no mass.     Tenderness: There is no abdominal tenderness. There is no guarding or rebound.  Musculoskeletal:        General: No tenderness.     Cervical back: Normal range of motion and neck supple.  Lymphadenopathy:     Cervical: No cervical adenopathy.  Skin:    Findings: No erythema or rash.  Neurological:     Mental Status: She is oriented  to person, place, and time.     Cranial Nerves: No cranial nerve deficit.     Motor: No abnormal muscle tone.     Coordination: Coordination normal.     Deep Tendon Reflexes: Reflexes normal.  Psychiatric:        Behavior: Behavior normal.        Thought Content: Thought content normal.        Judgment: Judgment normal.     Lab Results  Component Value Date   WBC 4.7 04/25/2018   HGB 12.2 04/25/2018   HCT 38.7 04/25/2018   PLT 246 04/25/2018   GLUCOSE 96 04/25/2018   CHOL 185 04/25/2018   TRIG 46 04/25/2018   HDL 60 04/25/2018   LDLCALC 116 (H) 04/25/2018   ALT 20 04/25/2018   AST 16 04/25/2018   NA 143 04/25/2018   K 4.6 04/25/2018   CL 105 04/25/2018   CREATININE 0.72 04/25/2018   BUN 11 04/25/2018   CO2 24 04/25/2018   TSH 3.090 04/25/2018   HGBA1C  5.6 08/12/2014    No results found.  Assessment & Plan:      Follow-up: No follow-ups on file.  Walker Kehr, MD

## 2019-05-21 NOTE — Assessment & Plan Note (Signed)
Lost wt on Noom

## 2019-05-21 NOTE — Assessment & Plan Note (Signed)
We discussed age appropriate health related issues, including available/recomended screening tests and vaccinations. We discussed a need for adhering to healthy diet and exercise. Labs/EKG were reviewed/ordered. All questions were answered. Colon Dr Collene Mares 1/14 nl Cologuard 2020 (-) CT coronary calcium score test was offered 2/20 GYN Dr Quincy Simmonds

## 2019-05-21 NOTE — Patient Instructions (Signed)

## 2019-05-22 ENCOUNTER — Other Ambulatory Visit: Payer: Self-pay | Admitting: Internal Medicine

## 2019-05-22 LAB — URINALYSIS
Bilirubin Urine: NEGATIVE
Hgb urine dipstick: NEGATIVE
Ketones, ur: NEGATIVE
Leukocytes,Ua: NEGATIVE
Nitrite: NEGATIVE
Specific Gravity, Urine: >=1.03 — AB (ref 1.000–1.030)
Total Protein, Urine: NEGATIVE
Urine Glucose: NEGATIVE
Urobilinogen, UA: 0.2 (ref 0.0–1.0)
pH: 5.5 (ref 5.0–8.0)

## 2019-05-22 MED ORDER — VITAMIN D (ERGOCALCIFEROL) 1.25 MG (50000 UNIT) PO CAPS: ORAL_CAPSULE | ORAL | 3 refills | Status: DC

## 2019-05-22 NOTE — Assessment & Plan Note (Signed)
Continue Nexium 

## 2019-06-20 ENCOUNTER — Other Ambulatory Visit: Payer: Self-pay

## 2019-06-20 ENCOUNTER — Other Ambulatory Visit (INDEPENDENT_AMBULATORY_CARE_PROVIDER_SITE_OTHER): Payer: BC Managed Care – PPO

## 2019-06-20 DIAGNOSIS — Z0000 Encounter for general adult medical examination without abnormal findings: Secondary | ICD-10-CM

## 2019-06-20 DIAGNOSIS — Z01419 Encounter for gynecological examination (general) (routine) without abnormal findings: Secondary | ICD-10-CM

## 2019-06-20 DIAGNOSIS — N951 Menopausal and female climacteric states: Secondary | ICD-10-CM

## 2019-06-20 DIAGNOSIS — R7989 Other specified abnormal findings of blood chemistry: Secondary | ICD-10-CM

## 2019-06-21 LAB — PROLACTIN: Prolactin: 34.7 ng/mL — ABNORMAL HIGH (ref 4.8–23.3)

## 2019-06-21 LAB — VITAMIN D 25 HYDROXY (VIT D DEFICIENCY, FRACTURES): Vit D, 25-Hydroxy: 42.4 ng/mL (ref 30.0–100.0)

## 2019-06-21 LAB — ESTRADIOL: Estradiol: 25.9 pg/mL

## 2019-06-21 LAB — FOLLICLE STIMULATING HORMONE: FSH: 30.3 m[IU]/mL

## 2019-06-25 MED ORDER — NORETHINDRONE 0.35 MG PO TABS: 1.0000 | ORAL_TABLET | Freq: Every day | ORAL | 3 refills | Status: DC

## 2019-09-25 ENCOUNTER — Telehealth: Payer: Self-pay | Admitting: Internal Medicine

## 2019-09-25 NOTE — Telephone Encounter (Signed)
    Patient requesting explanation regarding coding of 05/21/19 visit. Patient is being billed $153 for issue related to esophagus (she did not have the exact code with her) She has spoken to billing. Requesting additional clarification

## 2019-09-27 NOTE — Telephone Encounter (Signed)
Please advise 

## 2020-05-07 ENCOUNTER — Ambulatory Visit: Payer: BC Managed Care – PPO | Admitting: Obstetrics and Gynecology

## 2020-05-30 ENCOUNTER — Ambulatory Visit: Payer: Self-pay | Admitting: Obstetrics and Gynecology

## 2020-06-24 ENCOUNTER — Other Ambulatory Visit: Payer: Self-pay | Admitting: Obstetrics and Gynecology

## 2020-06-24 NOTE — Telephone Encounter (Signed)
Last AEX 05/07/19 Scheduled 08/14/20 with Dr. Quincy Simmonds

## 2020-08-13 NOTE — Progress Notes (Signed)
60 y.o. G0P0000 Single African American female here for annual exam.    Having some bloating.  No pelvic pain or bleeding.  Has some right upper abdominal discomfort at times.   Occasional hot flashes.   BMs are 3 -4 times a week.   Prolactin 34.7, FSH 30.3 and estradiol 25.9 on 06/20/19.  Last Micronor was 2 weeks ago.   Denies HA, visual changes, or nipple discharge.   Her last high dose of vit D was February.   Considering gastric sleeve surgery in [place].   One of her jobs is ending in [place].  Has another part time job.   Did Covid vaccination.  No booster done.   PCP: Bella Kennedy, MD    Patient's last menstrual period was 05/13/2013 (approximate).           Sexually active: No.  The current method of family planning is post menopausal status/Micronor.    Exercising: No.  The patient does not participate in regular exercise at present. Smoker:  no  Health Maintenance: Pap: 02-11-17 Neg:Neg HR HPV, 01-10-14 Neg, 01-10-10 Normal  History of abnormal Pap:  no MMG:  01-30-19 3D/neg/Birads1--knows to schedule Colonoscopy: 05-19-18 cologuard Neg, colonoscopy 05/2012 normal;next 10 years BMD:   n/a  Result  n/a TDaP:  11-21-15 Gardasil:   no HIV: no Hep C:11-21-15 Neg Screening Labs:  PCP.   reports that she has never smoked. She has never used smokeless tobacco. She reports current alcohol use of about 3.0 standard drinks of alcohol per week. She reports that she does not use drugs.  Past Medical History:  Diagnosis Date  . ALLERGIC RHINITIS 02/15/2008  . Fibroids 06/21/13   U/S [place] - 2 fibroids less than 1 cm  . Gallstones   . GERD 02/15/2008  . 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 Low vitamin D level 2020  . OBESITY, MORBID 02/15/2008    Past Surgical History:  Procedure Laterality Date  . LAPAROSCOPIC GASTRIC BANDING  2006   --Dr. Excell Seltzer    Current Outpatient Medications  Medication Sig Dispense Refill  . Cyanocobalamin (VITAMIN B-12) 1000 MCG SUBL Place 1  tablet (1,000 mcg total) under the tongue daily. 100 tablet 3  . norethindrone (HEATHER) 0.35 MG tablet Take 1 tablet (0.35 mg total) by mouth daily. 3 Package 3  . Vitamin D, Ergocalciferol, (DRISDOL) 1.25 MG (50000 UNIT) CAPS capsule TAKE 1 CAPSULE EVERY 14 DAYS 6 capsule 3   No current facility-administered medications for this visit.    Family History  Problem Relation Age of Onset  . Other Father        Dec age 26 MVA  . Osteoarthritis Mother   . Hypertension Mother   . Hyperlipidemia Mother   . Diabetes Other        1st degree relative  . Stroke Paternal Grandmother     Review of Systems  All other systems reviewed and are negative.   Exam:   BP 138/84 (Cuff Size: Large)   Pulse 65   Ht 5\' 5"  (1.651 m)   Wt (!) 327 lb (148.3 kg)   LMP 05/13/2013 (Approximate)   SpO2 100%   BMI 54.42 kg/m     General appearance: alert, cooperative and appears stated age Head: normocephalic, without obvious abnormality, atraumatic Neck: no adenopathy, supple, symmetrical, trachea midline and thyroid normal to inspection and palpation Lungs: clear to auscultation bilaterally Breasts: normal appearance, no masses or tenderness, No nipple retraction or dimpling, No nipple discharge or bleeding, No axillary adenopathy  Heart: regular rate and rhythm Abdomen: soft, non-tender; no masses, no organomegaly Extremities: extremities normal, atraumatic, no cyanosis or edema Skin: skin color, texture, turgor normal. No rashes or lesions Lymph nodes: cervical, supraclavicular, and axillary nodes normal. Neurologic: grossly normal  Pelvic: External genitalia:  no lesions              No abnormal inguinal nodes palpated.              Urethra:  normal appearing urethra with no masses, tenderness or lesions              Bartholins and Skenes: normal                 Vagina: normal appearing vagina with normal color and discharge, no lesions              Cervix: no lesions              Pap taken:  No. Bimanual Exam:  Uterus:  normal size, contour, position, consistency, mobility, non-tender              Adnexa: no mass, fullness, tenderness              Rectal exam: Yes.  .  Confirms.              Anus:  normal sphincter tone, no lesions  Chaperone was present for exam.  Assessment:   Well woman visit with normal exam. Amenorrhea. Thin endometrium on pelvic ultrasound in 2016. Perimenopausal by prior [facility] and estradiol.  Off Micronor for the last 2 weeks.  Obesity.Hxlap band. Hx elevated prolactin. She has had negative imaging of brain. Hx low vit D.   Plan: Mammogram screening discussed.  She will schedule.  Self breast awareness reviewed. Pap and HR HPV as above. Guidelines for Calcium, Vitamin D, regular exercise program including cardiovascular and weight bearing exercise. Check FSH, estradiol, prolactin, vit D. Routine labs with PCP. Follow up annually and prn.

## 2020-08-14 ENCOUNTER — Encounter: Payer: Self-pay | Admitting: Obstetrics and Gynecology

## 2020-08-14 ENCOUNTER — Other Ambulatory Visit: Payer: Self-pay

## 2020-08-14 ENCOUNTER — Ambulatory Visit (INDEPENDENT_AMBULATORY_CARE_PROVIDER_SITE_OTHER): Payer: 59 | Admitting: Obstetrics and Gynecology

## 2020-08-14 VITALS — BP 138/84 | HR 65 | Ht 65.0 in | Wt 327.0 lb

## 2020-08-14 DIAGNOSIS — R7989 Other specified abnormal findings of blood chemistry: Secondary | ICD-10-CM | POA: Diagnosis not present

## 2020-08-14 DIAGNOSIS — Z8639 Personal history of other endocrine, nutritional and metabolic disease: Secondary | ICD-10-CM | POA: Diagnosis not present

## 2020-08-14 DIAGNOSIS — Z01419 Encounter for gynecological examination (general) (routine) without abnormal findings: Secondary | ICD-10-CM | POA: Diagnosis not present

## 2020-08-14 DIAGNOSIS — N912 Amenorrhea, unspecified: Secondary | ICD-10-CM | POA: Diagnosis not present

## 2020-08-14 NOTE — Patient Instructions (Signed)

## 2020-08-15 LAB — PROLACTIN: Prolactin: 17.1 ng/mL

## 2020-08-15 LAB — ESTRADIOL: Estradiol: 31 pg/mL

## 2020-08-15 LAB — FOLLICLE STIMULATING HORMONE: FSH: 28.3 m[IU]/mL

## 2020-08-15 LAB — VITAMIN D 25 HYDROXY (VIT D DEFICIENCY, FRACTURES): Vit D, 25-Hydroxy: 52 ng/mL (ref 30–100)

## 2020-08-29 ENCOUNTER — Encounter: Payer: Self-pay | Admitting: Obstetrics and Gynecology

## 2020-12-15 ENCOUNTER — Encounter: Payer: Self-pay | Admitting: Obstetrics and Gynecology

## 2021-05-04 ENCOUNTER — Encounter: Payer: Self-pay | Admitting: Obstetrics and Gynecology

## 2021-05-22 ENCOUNTER — Telehealth: Payer: Self-pay | Admitting: Internal Medicine

## 2021-08-19 ENCOUNTER — Ambulatory Visit: Payer: 59 | Admitting: Obstetrics and Gynecology

## 2021-10-14 NOTE — Progress Notes (Signed)
61 y.o. G0P0000 Single African American female here for annual exam.    She has some right sided pain under her ribs. She had an ultrasound showing some gall stones but no inflammation.  She declined gall bladder removal.  Still has her gastric port from her weight loss surgery.   No vaginal bleeding.  Patient is not taking Micronor as her blood work indicates menopause.  She is not sexually active.   Taking Wegovy for weight loss.  Lost 26 pounds so far.   Wants vitamin d checked today. Taking vit D, two daily, 10,000 IU total.  PCP:  Bella Kennedy, MD   Patient's last menstrual period was 05/13/2013 (approximate).           Sexually active: No.  The current method of family planning is post menopausal status.    Exercising: Yes.     Exercise bands/resistance Smoker:  no  Health Maintenance: Pap:   02-11-17 Neg:Neg HR HPV, 01-10-14 Neg, 01-10-10 Normal  History of abnormal Pap:  no MMG:  10-12-21 pend, 08-29-20 Neg/Birads1 Colonoscopy:  05-19-18 cologuard Neg, colonoscopy 05/2012 normal;next 10 years.  She will do Cologuard.   She has the form for this.  BMD:   n/a  Result  n/a TDaP:  11-21-15 Gardasil:   n/a HIV: no Hep C: 11-21-15 Neg Screening Labs:  Weight loss clinic.    reports that she has never smoked. She has never used smokeless tobacco. She reports that she does not currently use alcohol. She reports that she does not use drugs.  Past Medical History:  Diagnosis Date   ALLERGIC RHINITIS 02/15/2008   Fibroids 06/21/13   U/S [place] - 2 fibroids less than 1 cm   Gallstones    GERD 02/15/2008   HERPES ZOSTER 07/26/2008   Low vitamin D level 2020   OBESITY, MORBID 02/15/2008    Past Surgical History:  Procedure Laterality Date   LAPAROSCOPIC GASTRIC BANDING  2006   --Dr. Excell Seltzer    Current Outpatient Medications  Medication Sig Dispense Refill   Semaglutide-Weight Management (WEGOVY) 0.5 MG/0.5ML SOAJ Inject into the skin.     triamterene-hydrochlorothiazide  (MAXZIDE-25) 37.5-25 MG tablet Take by mouth.     No current facility-administered medications for this visit.    Family History  Problem Relation Age of Onset   Other Father        Dec age 85 MVA   Osteoarthritis Mother    Hypertension Mother    Hyperlipidemia Mother    Diabetes Other        1st degree relative   Stroke Paternal Grandmother     Review of Systems  All other systems reviewed and are negative.   Exam:   BP 122/80   Pulse 98   Resp (!) 78   Ht 5' 5.25" (1.657 m)   Wt (!) 305 lb (138.3 kg)   LMP 05/13/2013 (Approximate)   BMI 50.37 kg/m     General appearance: alert, cooperative and appears stated age Head: normocephalic, without obvious abnormality, atraumatic Neck: no adenopathy, supple, symmetrical, trachea midline and thyroid normal to inspection and palpation Lungs: clear to auscultation bilaterally Breasts: normal appearance, no masses or tenderness, No nipple retraction or dimpling, No nipple discharge or bleeding, No axillary adenopathy Heart: regular rate and rhythm Abdomen: soft, non-tender; gastric bank port palpable in right upper quadrant and is nontender, no organomegaly.   Extremities: extremities normal, atraumatic, no cyanosis or edema Skin: skin color, texture, turgor normal. No rashes or lesions Lymph  nodes: cervical, supraclavicular, and axillary nodes normal. Neurologic: grossly normal  Pelvic: External genitalia:  no lesions              No abnormal inguinal nodes palpated.              Urethra:  normal appearing urethra with no masses, tenderness or lesions              Bartholins and Skenes: normal                 Vagina: normal appearing vagina with normal color and discharge, no lesions              Cervix: no lesions              Pap taken: yes Bimanual Exam:  Uterus:  normal size, contour, position, consistency, mobility, non-tender              Adnexa: no mass, fullness, tenderness              Rectal exam: yes.  Confirms.               Anus:  normal sphincter tone, no lesions  Chaperone was present for exam:  Estill Bamberg, CMA  Assessment:   Well woman visit with gynecologic exam. Obesity.  Hx lap band. Successful weight loss with VZDGLO.  Hx elevated prolactin. She has had negative imaging of brain.  Hx low vit D.  Cervical cancer screening.   Plan: Mammogram screening discussed. Self breast awareness reviewed. Pap and HR HPV as above. Guidelines for Calcium, Vitamin D, regular exercise program including cardiovascular and weight bearing exercise. Will check prolactin and vit D level. Follow up annually and prn.   After visit summary provided.

## 2021-10-15 ENCOUNTER — Ambulatory Visit (INDEPENDENT_AMBULATORY_CARE_PROVIDER_SITE_OTHER): Payer: 59 | Admitting: Obstetrics and Gynecology

## 2021-10-15 ENCOUNTER — Encounter: Payer: Self-pay | Admitting: Obstetrics and Gynecology

## 2021-10-15 ENCOUNTER — Other Ambulatory Visit (HOSPITAL_COMMUNITY)
Admission: RE | Admit: 2021-10-15 | Discharge: 2021-10-15 | Disposition: A | Discharge status: Home / Self Care | Payer: 59 | Source: Ambulatory Visit | Attending: Obstetrics and Gynecology | Admitting: Obstetrics and Gynecology

## 2021-10-15 VITALS — BP 122/80 | HR 98 | Resp 78 | Ht 65.25 in | Wt 305.0 lb

## 2021-10-15 DIAGNOSIS — Z8639 Personal history of other endocrine, nutritional and metabolic disease: Secondary | ICD-10-CM

## 2021-10-15 DIAGNOSIS — R7989 Other specified abnormal findings of blood chemistry: Secondary | ICD-10-CM | POA: Diagnosis not present

## 2021-10-15 DIAGNOSIS — Z01419 Encounter for gynecological examination (general) (routine) without abnormal findings: Secondary | ICD-10-CM | POA: Diagnosis not present

## 2021-10-15 DIAGNOSIS — Z124 Encounter for screening for malignant neoplasm of cervix: Secondary | ICD-10-CM | POA: Diagnosis present

## 2021-10-15 NOTE — Patient Instructions (Signed)

## 2021-10-16 LAB — CYTOLOGY - PAP
Comment: NEGATIVE
Diagnosis: NEGATIVE
High risk HPV: NEGATIVE

## 2021-10-16 LAB — PROLACTIN: Prolactin: 16.5 ng/mL

## 2021-10-16 LAB — VITAMIN D 25 HYDROXY (VIT D DEFICIENCY, FRACTURES): Vit D, 25-Hydroxy: 55 ng/mL (ref 30–100)

## 2021-10-21 ENCOUNTER — Encounter: Payer: Self-pay | Admitting: Obstetrics and Gynecology

## 2022-11-17 NOTE — Progress Notes (Deleted)
62 y.o. G0P0000 Single African American female here for annual exam.    PCP:     Patient's last menstrual period was 05/13/2013 (approximate).           Sexually active: {yes no:314532}  The current method of family planning is post menopausal status.    Exercising: {yes no:314532}  {types:19826} Smoker:  no  Health Maintenance: Pap:  02-11-17 Neg:Neg HR HPV, 01-10-14 Neg, 01-10-10 Normal   History of abnormal Pap:  no MMG:  10/21/21 Breast Density Cat B, BI-RADS CAT 2 benign Colonoscopy:  cologuard 05/19/18 neg BMD:   n/a  Result  n/a TDaP:  11/21/15 Gardasil:   no HIV: n/a Hep C: 11/21/15 neg Screening Labs:  Hb today: ***, Urine today: ***   reports that she has never smoked. She has never used smokeless tobacco. She reports that she does not currently use alcohol. She reports that she does not use drugs.  Past Medical History:  Diagnosis Date   ALLERGIC RHINITIS 02/15/2008   Fibroids 06/21/13   U/S [place] - 2 fibroids less than 1 cm   Gallstones    GERD 02/15/2008   HERPES ZOSTER 07/26/2008   Low vitamin D level 2020   OBESITY, MORBID 02/15/2008    Past Surgical History:  Procedure Laterality Date   LAPAROSCOPIC GASTRIC BANDING  2006   --Dr. Johna Sheriff    Current Outpatient Medications  Medication Sig Dispense Refill   Semaglutide-Weight Management (WEGOVY) 0.5 MG/0.5ML SOAJ Inject into the skin.     No current facility-administered medications for this visit.    Family History  Problem Relation Age of Onset   Other Father        Dec age 72 MVA   Osteoarthritis Mother    Hypertension Mother    Hyperlipidemia Mother    Diabetes Other        1st degree relative   Stroke Paternal Grandmother     Review of Systems  Exam:   LMP 05/13/2013 (Approximate)     General appearance: alert, cooperative and appears stated age Head: normocephalic, without obvious abnormality, atraumatic Neck: no adenopathy, supple, symmetrical, trachea midline and thyroid normal to  inspection and palpation Lungs: clear to auscultation bilaterally Breasts: normal appearance, no masses or tenderness, No nipple retraction or dimpling, No nipple discharge or bleeding, No axillary adenopathy Heart: regular rate and rhythm Abdomen: soft, non-tender; no masses, no organomegaly Extremities: extremities normal, atraumatic, no cyanosis or edema Skin: skin color, texture, turgor normal. No rashes or lesions Lymph nodes: cervical, supraclavicular, and axillary nodes normal. Neurologic: grossly normal  Pelvic: External genitalia:  no lesions              No abnormal inguinal nodes palpated.              Urethra:  normal appearing urethra with no masses, tenderness or lesions              Bartholins and Skenes: normal                 Vagina: normal appearing vagina with normal color and discharge, no lesions              Cervix: no lesions              Pap taken: {yes no:314532} Bimanual Exam:  Uterus:  normal size, contour, position, consistency, mobility, non-tender              Adnexa: no mass, fullness, tenderness  Rectal exam: {yes no:314532}.  Confirms.              Anus:  normal sphincter tone, no lesions  Chaperone was present for exam:  ***  Assessment:   Well woman visit with gynecologic exam.   Plan: Mammogram screening discussed. Self breast awareness reviewed. Pap and HR HPV as above. Guidelines for Calcium, Vitamin D, regular exercise program including cardiovascular and weight bearing exercise.   Follow up annually and prn.   Additional counseling given.  {yes [id number]. _______ minutes face to face time of which over 50% was spent in counseling.    After visit summary provided.    '

## 2022-12-01 ENCOUNTER — Ambulatory Visit: Payer: 59 | Admitting: Obstetrics and Gynecology

## 2023-03-09 NOTE — Progress Notes (Unsigned)
62 y.o. G0P0000 Single African American female here for annual exam.    PCP: Plotnikov, Georgina Quint, MD   Patient's last menstrual period was 05/13/2013 (approximate).           Sexually active: Yes.    The current method of family planning is post menopausal status.    Menopausal hormone therapy:  n/a Exercising: {yes no:314532}  {types:19826} Smoker:  no  OB History  Gravida Para Term Preterm AB Living  0 0 0 0 0 0  SAB IAB Ectopic Multiple Live Births  0 0 0 0       HEALTH MAINTENANCE: Last 2 paps:  10/15/21 neg: HR HPV neg, 02/11/17 neg: HR HPV neg History of abnormal Pap or positive HPV:  no Mammogram:   10/21/21 Breast Density Cat B, BI-RADS CAT 2 benign Colonoscopy:  05/19/18 neg cologuard Bone Density:  n/a  Result  n/a   Immunization History  Administered Date(s) Administered   Influenza-Unspecified 01/03/2017   Moderna Sars-Covid-2 Vaccination 06/28/2019, 07/26/2019   Td 04/12/2005   Tdap 11/21/2015      reports that she has never smoked. She has never used smokeless tobacco. She reports that she does not currently use alcohol. She reports that she does not use drugs.  Past Medical History:  Diagnosis Date   ALLERGIC RHINITIS 02/15/2008   Fibroids 06/21/13   U/S [place] - 2 fibroids less than 1 cm   Gallstones    GERD 02/15/2008   HERPES ZOSTER 07/26/2008   Low vitamin D level 2020   OBESITY, MORBID 02/15/2008    Past Surgical History:  Procedure Laterality Date   LAPAROSCOPIC GASTRIC BANDING  2006   --Dr. Johna Sheriff    Current Outpatient Medications  Medication Sig Dispense Refill   Semaglutide-Weight Management (WEGOVY) 0.5 MG/0.5ML SOAJ Inject into the skin.     No current facility-administered medications for this visit.    ALLERGIES: Patient has no known allergies.  Family History  Problem Relation Age of Onset   Other Father        Dec age 14 MVA   Osteoarthritis Mother    Hypertension Mother    Hyperlipidemia Mother    Diabetes Other         1st degree relative   Stroke Paternal Grandmother     Review of Systems  PHYSICAL EXAM:  LMP 05/13/2013 (Approximate)     General appearance: alert, cooperative and appears stated age Head: normocephalic, without obvious abnormality, atraumatic Neck: no adenopathy, supple, symmetrical, trachea midline and thyroid normal to inspection and palpation Lungs: clear to auscultation bilaterally Breasts: normal appearance, no masses or tenderness, No nipple retraction or dimpling, No nipple discharge or bleeding, No axillary adenopathy Heart: regular rate and rhythm Abdomen: soft, non-tender; no masses, no organomegaly Extremities: extremities normal, atraumatic, no cyanosis or edema Skin: skin color, texture, turgor normal. No rashes or lesions Lymph nodes: cervical, supraclavicular, and axillary nodes normal. Neurologic: grossly normal  Pelvic: External genitalia:  no lesions              No abnormal inguinal nodes palpated.              Urethra:  normal appearing urethra with no masses, tenderness or lesions              Bartholins and Skenes: normal                 Vagina: normal appearing vagina with normal color and discharge, no lesions  Cervix: no lesions              Pap taken: {yes no:314532} Bimanual Exam:  Uterus:  normal size, contour, position, consistency, mobility, non-tender              Adnexa: no mass, fullness, tenderness              Rectal exam: {yes no:314532}.  Confirms.              Anus:  normal sphincter tone, no lesions  Chaperone was present for exam:  {BSCHAPERONE:31226::"Cloy Cozzens F, CMA"}  ASSESSMENT: Well woman visit with gynecologic exam  ***  PLAN: Mammogram screening discussed. Self breast awareness reviewed. Pap and HRV collected:  {yes no:314532} Guidelines for Calcium, Vitamin D, regular exercise program including cardiovascular and weight bearing exercise. Medication refills:  *** {LABS (Optional):23779} Follow up:  ***     Additional counseling given.  {yes [id number]. ***  total time was spent for this patient encounter, including preparation, face-to-face counseling with the patient, coordination of care, and documentation of the encounter in addition to doing the well woman visit with gynecologic exam.

## 2023-03-23 ENCOUNTER — Ambulatory Visit: Payer: 59 | Admitting: Obstetrics and Gynecology

## 2023-04-07 ENCOUNTER — Ambulatory Visit: Payer: 59 | Admitting: Obstetrics and Gynecology

## 2023-05-23 NOTE — Progress Notes (Deleted)
 63 y.o. G0P0000 Single African American female here for annual exam.    PCP: Plotnikov, Georgina Quint, MD   Patient's last menstrual period was 05/13/2013 (approximate).           Sexually active: No.  The current method of family planning is post menopausal status.    Menopausal hormone therapy:  n/a Exercising: {yes no:314532}  {types:19826} Smoker:  no  OB History  Gravida Para Term Preterm AB Living  0 0 0 0 0 0  SAB IAB Ectopic Multiple Live Births  0 0 0 0      HEALTH MAINTENANCE: Last 2 paps:  10/15/21 neg: HR HPV neg,02/11/17 neg: HR HPV neg History of abnormal Pap or positive HPV:  no Mammogram:   10/21/21 Breast Density cat B, BI-RADS CAT 2 benign Colonoscopy:  05-19-18 cologuard Neg, colonoscopy 05/2012 normal;next 10 years.  She will do Cologuard.   She has the form for this.  Bone Density:  n/a  Result  n/a   Immunization History  Administered Date(s) Administered   Influenza-Unspecified 01/03/2017   Moderna Sars-Covid-2 Vaccination 06/28/2019, 07/26/2019   Td 04/12/2005   Tdap 11/21/2015      reports that she has never smoked. She has never used smokeless tobacco. She reports that she does not currently use alcohol. She reports that she does not use drugs.  Past Medical History:  Diagnosis Date   ALLERGIC RHINITIS 02/15/2008   Fibroids 06/21/13   U/S [place] - 2 fibroids less than 1 cm   Gallstones    GERD 02/15/2008   HERPES ZOSTER 07/26/2008   Low vitamin D level 2020   OBESITY, MORBID 02/15/2008    Past Surgical History:  Procedure Laterality Date   LAPAROSCOPIC GASTRIC BANDING  2006   --Dr. Johna Sheriff    Current Outpatient Medications  Medication Sig Dispense Refill   Semaglutide-Weight Management (WEGOVY) 0.5 MG/0.5ML SOAJ Inject into the skin.     No current facility-administered medications for this visit.    ALLERGIES: Patient has no known allergies.  Family History  Problem Relation Age of Onset   Other Father        Dec age 62 MVA    Osteoarthritis Mother    Hypertension Mother    Hyperlipidemia Mother    Diabetes Other        1st degree relative   Stroke Paternal Grandmother     Review of Systems  PHYSICAL EXAM:  LMP 05/13/2013 (Approximate)     General appearance: alert, cooperative and appears stated age Head: normocephalic, without obvious abnormality, atraumatic Neck: no adenopathy, supple, symmetrical, trachea midline and thyroid normal to inspection and palpation Lungs: clear to auscultation bilaterally Breasts: normal appearance, no masses or tenderness, No nipple retraction or dimpling, No nipple discharge or bleeding, No axillary adenopathy Heart: regular rate and rhythm Abdomen: soft, non-tender; no masses, no organomegaly Extremities: extremities normal, atraumatic, no cyanosis or edema Skin: skin color, texture, turgor normal. No rashes or lesions Lymph nodes: cervical, supraclavicular, and axillary nodes normal. Neurologic: grossly normal  Pelvic: External genitalia:  no lesions              No abnormal inguinal nodes palpated.              Urethra:  normal appearing urethra with no masses, tenderness or lesions              Bartholins and Skenes: normal  Vagina: normal appearing vagina with normal color and discharge, no lesions              Cervix: no lesions              Pap taken: {yes no:314532} Bimanual Exam:  Uterus:  normal size, contour, position, consistency, mobility, non-tender              Adnexa: no mass, fullness, tenderness              Rectal exam: {yes no:314532}.  Confirms.              Anus:  normal sphincter tone, no lesions  Chaperone was present for exam:  {BSCHAPERONE:31226::"Emmory Solivan F, CMA"}  ASSESSMENT: Well woman visit with gynecologic exam  ***  PLAN: Mammogram screening discussed. Self breast awareness reviewed. Pap and HRV collected:  {yes no:314532} Guidelines for Calcium, Vitamin D, regular exercise program including cardiovascular and  weight bearing exercise. Medication refills:  *** {LABS (Optional):23779} Follow up:  ***    Additional counseling given.  {yes [id number]. ***  total time was spent for this patient encounter, including preparation, face-to-face counseling with the patient, coordination of care, and documentation of the encounter in addition to doing the well woman visit with gynecologic exam.

## 2023-06-06 ENCOUNTER — Ambulatory Visit: Payer: Self-pay | Admitting: Obstetrics and Gynecology

## 2023-06-28 LAB — EXTERNAL GENERIC LAB PROCEDURE: COLOGUARD: NEGATIVE

## 2023-06-28 LAB — COLOGUARD: COLOGUARD: NEGATIVE

## 2023-07-05 ENCOUNTER — Ambulatory Visit: Payer: Self-pay | Admitting: Obstetrics and Gynecology

## 2023-07-11 NOTE — Progress Notes (Signed)
 63 y.o. G0P0000 Single African American female here for annual exam.    No bleeding.    May restart compounded Wegovy through her PCP.   Hx elevated prolactin.  Some HA with allergies.  No nipple discharge.    PHQ9:  6.  States she has concerns about what is going on in the world.  Has a therapist through the weight loss clinic in [place].  Not suicidal.   PCP: Pcp, No - Selena Maisie [place].   Patient's last menstrual period was 05/13/2013 (approximate).           Sexually active: No.  The current method of family planning is post menopausal status.    Menopausal hormone therapy:  n/a Exercising: No.   Smoker:  no  OB History  Gravida Para Term Preterm AB Living  0 0 0 0 0 0  SAB IAB Ectopic Multiple Live Births  0 0 0 0      HEALTH MAINTENANCE: Last 2 paps:  10/15/21 neg: HR HPV neg, 02/11/17 neg: HR HPV neg History of abnormal Pap or positive HPV:  no Mammogram:   10/21/21 Breast density cat B, BI-RADS CAT 2 benign Colonoscopy:  cologuard 06/2023 per pt Bone Density:  n/a  Result  n/a   Immunization History  Administered Date(s) Administered   Influenza-Unspecified 01/03/2017   Moderna Sars-Covid-2 Vaccination 06/28/2019, 07/26/2019   Td 04/12/2005   Tdap 11/21/2015      reports that she has never smoked. She has never used smokeless tobacco. She reports that she does not currently use alcohol. She reports that she does not use drugs.  Past Medical History:  Diagnosis Date   ALLERGIC RHINITIS 02/15/2008   Elevated prolactin level    Fibroids 06/21/2013   [facility] [place] - 2 fibroids less than 1 cm   Gallstones    GERD 02/15/2008   HERPES ZOSTER 07/26/2008   Low vitamin D level 2020   OBESITY, MORBID 02/15/2008    Past Surgical History:  Procedure Laterality Date   LAPAROSCOPIC GASTRIC BANDING  2006   --Dr. Alray Askew    No current outpatient medications on file.   No current facility-administered medications for this visit.    ALLERGIES:  Patient has no known allergies.  Family History  Problem Relation Age of Onset   Other Father        Dec age 52 MVA   Osteoarthritis Mother    Hypertension Mother    Hyperlipidemia Mother    Diabetes Other        1st degree relative   Stroke Paternal Grandmother     Review of Systems  All other systems reviewed and are negative.   PHYSICAL EXAM:  BP 126/84 (BP Location: Left Arm, Patient Position: Sitting, Cuff Size: Large)   Ht 5' 5.75" (1.67 m)   Wt (!) 313 lb (142 kg)   LMP 05/13/2013 (Approximate)   BMI 50.90 kg/m     General appearance: alert, cooperative and appears stated age Head: normocephalic, without obvious abnormality, atraumatic Neck: no adenopathy, supple, symmetrical, trachea midline and thyroid normal to inspection and palpation Lungs: clear to auscultation bilaterally Breasts: normal appearance, no masses or tenderness, No nipple retraction or dimpling, No nipple discharge or bleeding, No axillary adenopathy Heart: regular rate and rhythm Abdomen: her balloon is palpable in right upper quadrant, abdomen is soft, non-tender; no masses, no organomegaly Extremities: extremities normal, atraumatic, no cyanosis or edema Skin: skin color, texture, turgor normal. No rashes or lesions Lymph nodes: cervical,  supraclavicular, and axillary nodes normal. Neurologic: grossly normal  Pelvic: External genitalia:  no lesions              No abnormal inguinal nodes palpated.              Urethra:  normal appearing urethra with no masses, tenderness or lesions              Bartholins and Skenes: normal                 Vagina: normal appearing vagina with normal color and discharge, no lesions              Cervix: no lesions              Pap taken: no Bimanual Exam:  Uterus:  normal size, contour, position, consistency, mobility, non-tender              Adnexa: no mass, fullness, tenderness              Rectal exam: declined  Chaperone was present for exam:  Emmaline Haring,  CMA  ASSESSMENT: Well woman visit with gynecologic exam. Obesity.  Hx lap band. Hx elevated prolactin. She has had negative imaging of brain.  Hx low vit D.  PHQ-9: 6   PLAN: Mammogram screening discussed. Self breast awareness reviewed. Pap and HRV collected:  no.  Due in 2028. Guidelines for Calcium, Vitamin D, regular exercise program including cardiovascular and weight bearing exercise. Medication refills:  NA Check prolactin.  Brochure given for [employer] if desired.  Follow up:  yearly and prn.

## 2023-07-25 ENCOUNTER — Encounter: Payer: Self-pay | Admitting: Obstetrics and Gynecology

## 2023-07-25 ENCOUNTER — Ambulatory Visit (INDEPENDENT_AMBULATORY_CARE_PROVIDER_SITE_OTHER): Payer: Self-pay | Admitting: Obstetrics and Gynecology

## 2023-07-25 VITALS — BP 126/84 | Ht 65.75 in | Wt 313.0 lb

## 2023-07-25 DIAGNOSIS — Z01419 Encounter for gynecological examination (general) (routine) without abnormal findings: Secondary | ICD-10-CM | POA: Diagnosis not present

## 2023-07-25 DIAGNOSIS — Z1331 Encounter for screening for depression: Secondary | ICD-10-CM

## 2023-07-25 DIAGNOSIS — R7989 Other specified abnormal findings of blood chemistry: Secondary | ICD-10-CM | POA: Diagnosis not present

## 2023-07-25 NOTE — Patient Instructions (Signed)

## 2023-07-26 ENCOUNTER — Encounter: Payer: Self-pay | Admitting: Obstetrics and Gynecology

## 2023-07-26 LAB — PROLACTIN: Prolactin: 14.7 ng/mL

## 2024-07-25 ENCOUNTER — Ambulatory Visit: Admitting: Obstetrics and Gynecology
